# Patient Record
Sex: Female | Born: 1995 | Race: White | Hispanic: No | Marital: Married | Smoking: Never smoker
Health system: Southern US, Community
[De-identification: ages and names within clinical notes are randomized; demographics above are authoritative.]

## PROBLEM LIST (undated history)

## (undated) DIAGNOSIS — N6310 Unspecified lump in the right breast, unspecified quadrant: Secondary | ICD-10-CM

## (undated) DIAGNOSIS — Z803 Family history of malignant neoplasm of breast: Secondary | ICD-10-CM

## (undated) DIAGNOSIS — J45909 Unspecified asthma, uncomplicated: Secondary | ICD-10-CM

## (undated) DIAGNOSIS — Z9189 Other specified personal risk factors, not elsewhere classified: Secondary | ICD-10-CM

## (undated) DIAGNOSIS — Z9889 Other specified postprocedural states: Secondary | ICD-10-CM

## (undated) DIAGNOSIS — R112 Nausea with vomiting, unspecified: Secondary | ICD-10-CM

## (undated) HISTORY — DX: Family history of malignant neoplasm of breast: Z80.3

## (undated) HISTORY — PX: WISDOM TOOTH EXTRACTION: SHX21

## (undated) HISTORY — DX: Other specified personal risk factors, not elsewhere classified: Z91.89

## (undated) HISTORY — DX: Unspecified asthma, uncomplicated: J45.909

## (undated) HISTORY — PX: BREAST SURGERY: SHX581

---
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
## 2016-02-15 ENCOUNTER — Encounter: Payer: Self-pay | Admitting: Physician Assistant

## 2016-02-15 ENCOUNTER — Ambulatory Visit (INDEPENDENT_AMBULATORY_CARE_PROVIDER_SITE_OTHER): Payer: BC Managed Care – PPO | Admitting: Physician Assistant

## 2016-02-15 VITALS — BP 110/60 | HR 78 | Temp 98.4°F | Resp 16 | Wt 121.6 lb

## 2016-02-15 DIAGNOSIS — J45909 Unspecified asthma, uncomplicated: Secondary | ICD-10-CM | POA: Diagnosis not present

## 2016-02-15 DIAGNOSIS — Z30011 Encounter for initial prescription of contraceptive pills: Secondary | ICD-10-CM

## 2016-02-15 DIAGNOSIS — Z3040 Encounter for surveillance of contraceptives, unspecified: Secondary | ICD-10-CM

## 2016-02-15 MED ORDER — FLUTICASONE-SALMETEROL 100-50 MCG/DOSE IN AEPB
1.0000 | INHALATION_SPRAY | Freq: Two times a day (BID) | RESPIRATORY_TRACT | Status: DC
Start: 2016-02-15 — End: 2016-02-15

## 2016-02-15 MED ORDER — FLUTICASONE-SALMETEROL 250-50 MCG/DOSE IN AEPB: 1.0000 | INHALATION_SPRAY | Freq: Two times a day (BID) | RESPIRATORY_TRACT | Status: DC

## 2016-02-15 MED ORDER — DESOGESTREL-ETHINYL ESTRADIOL 0.15-30 MG-MCG PO TABS: 1.0000 | ORAL_TABLET | Freq: Every day | ORAL | Status: DC

## 2016-02-15 NOTE — Patient Instructions (Signed)

## 2016-02-15 NOTE — Progress Notes (Signed)
       Patient: Shelley Walton Female    DOB: [birth year]   20 y.o.   MRN: [id number] Visit Date: 02/15/2016  Today's Provider: Mar Daring, PA-C   Chief Complaint  Patient presents with  . Follow-up    medication/birthcontrol   Subjective:    HPI Birth Control Mediaction: Patient presents today for refill on contraception medication Reclipsen.The patient has no complaints today. The patient is sexually active. Pertinent past medical history: sexually transmitted diseases, chlamydia x 1. No symptoms today. No breakthrough bleeding.  Asthma: She needs refill also on her Advair. She reports that is stable no symptoms. No SOB, wheezing or chest tightness or cough.    No Known Allergies Current Meds  Medication Sig  . desogestrel-ethinyl estradiol (APRI,EMOQUETTE,SOLIA) 0.15-30 MG-MCG tablet Take 1 tablet by mouth daily.  . ondansetron (ZOFRAN) 4 MG tablet Take 1 tablet (4 mg total) by mouth daily as needed for nausea or vomiting.    Review of Systems  Constitutional: Negative.   Respiratory: Negative for cough, chest tightness, shortness of breath and wheezing.   Cardiovascular: Negative for chest pain, palpitations and leg swelling.  Gastrointestinal: Negative.   Genitourinary: Negative.     Social History  Substance Use Topics  . Smoking status: Never Smoker   . Smokeless tobacco: Never Used  . Alcohol Use: No   Objective:   BP 110/60 mmHg  Pulse 78  Temp(Src) 98.4 F (36.9 C) (Oral)  Resp 16  Wt 121 lb 9.6 oz (55.157 kg)  LMP 02/08/2016  Physical Exam  Constitutional: She appears well-developed and well-nourished. No distress.  Neck: Normal range of motion. Neck supple. No tracheal deviation present. No thyromegaly present.  Cardiovascular: Normal rate, regular rhythm and normal heart sounds.  Exam reveals no gallop and no friction rub.   No murmur heard. Pulmonary/Chest: Effort normal and breath sounds normal. No respiratory distress. She has no  wheezes. She has no rales.  Lymphadenopathy:    She has no cervical adenopathy.  Skin: She is not diaphoretic.  Vitals reviewed.       Assessment & Plan:     1. Asthma, unspecified asthma severity, uncomplicated Stable. Diagnosis pulled for medication refill. Continue current medical treatment plan. - Fluticasone-Salmeterol (ADVAIR) 250-50 MCG/DOSE AEPB; Inhale 1 puff into the lungs 2 (two) times daily.  Dispense: 60 each; Refill: 11  2. Contraception, generic surveillance Stable. Diagnosis pulled for medication refill. Continue current medical treatment plan. - desogestrel-ethinyl estradiol (APRI,EMOQUETTE,SOLIA) 0.15-30 MG-MCG tablet; Take 1 tablet by mouth daily.  Dispense: 3 Package; Refill: [place], PA-C  [facility]

## 2016-09-05 ENCOUNTER — Other Ambulatory Visit: Payer: Self-pay | Admitting: General Surgery

## 2016-09-05 DIAGNOSIS — D242 Benign neoplasm of left breast: Secondary | ICD-10-CM

## 2016-09-05 DIAGNOSIS — N63 Unspecified lump in unspecified breast: Secondary | ICD-10-CM

## 2016-09-26 ENCOUNTER — Encounter: Payer: Self-pay | Admitting: Physician Assistant

## 2016-09-26 ENCOUNTER — Ambulatory Visit (INDEPENDENT_AMBULATORY_CARE_PROVIDER_SITE_OTHER): Payer: BC Managed Care – PPO | Admitting: Physician Assistant

## 2016-09-26 VITALS — BP 112/76 | HR 76 | Temp 98.4°F | Resp 16 | Wt 133.0 lb

## 2016-09-26 DIAGNOSIS — J069 Acute upper respiratory infection, unspecified: Secondary | ICD-10-CM | POA: Diagnosis not present

## 2016-09-26 DIAGNOSIS — B9789 Other viral agents as the cause of diseases classified elsewhere: Secondary | ICD-10-CM | POA: Diagnosis not present

## 2016-09-26 DIAGNOSIS — J45909 Unspecified asthma, uncomplicated: Secondary | ICD-10-CM

## 2016-09-26 NOTE — Progress Notes (Signed)
[facility]  Chief Complaint  Patient presents with  . URI    Subjective:    Patient ID: Shelley Walton, female    DOB: [birth year], 21 y.o.   MRN: [id number]  Upper Respiratory Infection: Shelley Walton is a 21 y.o. female with a past medical history significant for asthma on albuterol complaining of symptoms of a URI ongoing since yesterday morning. She works with [employer]. Symptoms include congestion and cough. Onset of symptoms was 1 day ago, gradually worsening since that time. She also c/o bilateral ear pressure/pain, congestion, cough described as nonproductive, lightheadedness and nasal congestion for the past 1 day .  She is drinking plenty of fluids. Evaluation to date: none. Treatment to date: cough suppressants. Has not tried her albuterol inhaler. The treatment has provided no relief.   Review of Systems  Constitutional: Positive for chills and fatigue. Negative for activity change, appetite change, diaphoresis, fever and unexpected weight change.  HENT: Positive for congestion, hearing loss (Ears "Feel full"), sinus pain and sinus pressure. Negative for ear discharge, ear pain, mouth sores, nosebleeds, postnasal drip, rhinorrhea, sneezing, sore throat, tinnitus, trouble swallowing and voice change.   Eyes: Negative.   Respiratory: Positive for cough, chest tightness, shortness of breath and wheezing. Negative for apnea, choking and stridor.   Gastrointestinal: Positive for nausea. Negative for abdominal distention, abdominal pain, anal bleeding, blood in stool, constipation, diarrhea, rectal pain and vomiting.  Neurological: Positive for light-headedness and headaches. Negative for dizziness.       Objective:   BP 112/76 (BP Location: Left Arm, Patient Position: Sitting, Cuff Size: Normal)   Pulse 76   Temp 98.4 F (36.9 C) (Oral)   Resp 16   Wt 133 lb (60.3 kg)   LMP 09/05/2016   SpO2 97%   BMI 22.83 kg/m   Patient  Active Problem List   Diagnosis Date Noted  . Contraception, generic surveillance 06/20/2015  . Chlamydia 06/09/2015  . Acne 02/24/2015  . Asthma 02/24/2015    Outpatient Encounter Prescriptions as of 09/26/2016  Medication Sig  . desogestrel-ethinyl estradiol (APRI,EMOQUETTE,SOLIA) 0.15-30 MG-MCG tablet Take 1 tablet by mouth daily.  . Fluticasone-Salmeterol (ADVAIR) 250-50 MCG/DOSE AEPB Inhale 1 puff into the lungs 2 (two) times daily.  . ondansetron (ZOFRAN) 4 MG tablet Take 1 tablet (4 mg total) by mouth daily as needed for nausea or vomiting.   No facility-administered encounter medications on file as of 09/26/2016.     No Known Allergies     Physical Exam  Constitutional: She appears well-developed and well-nourished.  HENT:  Right Ear: External ear normal.  Left Ear: External ear normal.  Mouth/Throat: Oropharynx is clear and moist. No oropharyngeal exudate.  Eyes: Right eye exhibits discharge. Left eye exhibits discharge.  Neck: Neck supple.  Cardiovascular: Normal rate and regular rhythm.   Pulmonary/Chest: Effort normal and breath sounds normal. No respiratory distress. She has no wheezes. She has no rales.  No SOB.  Lymphadenopathy:    She has no cervical adenopathy.  Skin: Skin is warm and dry.  Psychiatric: She has a normal mood and affect. Her behavior is normal.       Assessment & Plan:   1. Uncomplicated asthma, unspecified asthma severity, unspecified whether persistent  Oxygenating well, no SOB or wheezing on exam. Advised to use albuterol inhaler Q4H scheduled x 3 days. May call back if not feeling relief and we can add steroids.  2. Viral upper respiratory tract infection  See above.  Recommend rest, fluids, frequent hand washing. Work note provided  Return if symptoms worsen or fail to improve.   Patient Instructions  Asthma, Adult Asthma is a recurring condition in which the airways tighten and narrow. Asthma can make it difficult to  breathe. It can cause coughing, wheezing, and shortness of breath. Asthma episodes, also called asthma attacks, range from minor to life-threatening. Asthma cannot be cured, but medicines and lifestyle changes can help control it. What are the causes? Asthma is believed to be caused by inherited (genetic) and environmental factors, but its exact cause is unknown. Asthma may be triggered by allergens, lung infections, or irritants in the air. Asthma triggers are different for each person. Common triggers include:  Animal dander.  Dust mites.  Cockroaches.  Pollen from trees or grass.  Mold.  Smoke.  Air pollutants such as dust, household cleaners, hair sprays, aerosol sprays, paint fumes, strong chemicals, or strong odors.  Cold air, weather changes, and winds (which increase molds and pollens in the air).  Strong emotional expressions such as crying or laughing hard.  Stress.  Certain medicines (such as aspirin) or types of drugs (such as beta-blockers).  Sulfites in foods and drinks. Foods and drinks that may contain sulfites include dried fruit, potato chips, and sparkling grape juice.  Infections or inflammatory conditions such as the flu, a cold, or an inflammation of the nasal membranes (rhinitis).  Gastroesophageal reflux disease (GERD).  Exercise or strenuous activity. What are the signs or symptoms? Symptoms may occur immediately after asthma is triggered or many hours later. Symptoms include:  Wheezing.  Excessive nighttime or early morning coughing.  Frequent or severe coughing with a common cold.  Chest tightness.  Shortness of breath. How is this diagnosed? The diagnosis of asthma is made by a review of your medical history and a physical exam. Tests may also be performed. These may include:  Lung function studies. These tests show how much air you breathe in and out.  Allergy tests.  Imaging tests such as X-rays. How is this treated? Asthma cannot be  cured, but it can usually be controlled. Treatment involves identifying and avoiding your asthma triggers. It also involves medicines. There are 2 classes of medicine used for asthma treatment:  Controller medicines. These prevent asthma symptoms from occurring. They are usually taken every day.  Reliever or rescue medicines. These quickly relieve asthma symptoms. They are used as needed and provide short-term relief. Your health care provider will help you create an asthma action plan. An asthma action plan is a written plan for managing and treating your asthma attacks. It includes a list of your asthma triggers and how they may be avoided. It also includes information on when medicines should be taken and when their dosage should be changed. An action plan may also involve the use of a device called a peak flow meter. A peak flow meter measures how well the lungs are working. It helps you monitor your condition. Follow these instructions at home:  Take medicines only as directed by your health care provider. Speak with your health care provider if you have questions about how or when to take the medicines.  Use a peak flow meter as directed by your health care provider. Record and keep track of readings.  Understand and use the action plan to help minimize or stop an asthma attack without needing to seek medical care.  Control your home environment in the following ways to help prevent asthma attacks:  Do not smoke. Avoid being exposed to secondhand smoke.  Change your heating and air conditioning filter regularly.  Limit your use of fireplaces and wood stoves.  Get rid of pests (such as roaches and mice) and their droppings.  Throw away plants if you see mold on them.  Clean your floors and dust regularly. Use unscented cleaning products.  Try to have someone else vacuum for you regularly. Stay out of rooms while they are being vacuumed and for a short while afterward. If you vacuum,  use a dust mask from a hardware store, a double-layered or microfilter vacuum cleaner bag, or a vacuum cleaner with a HEPA filter.  Replace carpet with wood, tile, or vinyl flooring. Carpet can trap dander and dust.  Use allergy-proof pillows, mattress covers, and box spring covers.  Wash bed sheets and blankets every week in hot water and dry them in a dryer.  Use blankets that are made of polyester or cotton.  Clean bathrooms and kitchens with bleach. If possible, have someone repaint the walls in these rooms with mold-resistant paint. Keep out of the rooms that are being cleaned and painted.  Wash hands frequently. Contact a health care provider if:  You have wheezing, shortness of breath, or a cough even if taking medicine to prevent attacks.  The colored mucus you cough up (sputum) is thicker than usual.  Your sputum changes from clear or white to yellow, green, gray, or bloody.  You have any problems that may be related to the medicines you are taking (such as a rash, itching, swelling, or trouble breathing).  You are using a reliever medicine more than 2-3 times per week.  Your peak flow is still at 50-79% of your personal best after following your action plan for 1 hour.  You have a fever. Get help right away if:  You seem to be getting worse and are unresponsive to treatment during an asthma attack.  You are short of breath even at rest.  You get short of breath when doing very little physical activity.  You have difficulty eating, drinking, or talking due to asthma symptoms.  You develop chest pain.  You develop a fast heartbeat.  You have a bluish color to your lips or fingernails.  You are light-headed, dizzy, or faint.  Your peak flow is less than 50% of your personal best. This information is not intended to replace advice given to you by your health care provider. Make sure you discuss any questions you have with your health care provider. Document  Released: 08/13/2005 Document Revised: 01/25/2016 Document Reviewed: 03/12/2013 Elsevier Interactive Patient Education  2017 [employer].     The entirety of the information documented in the History of Present Illness, Review of Systems and Physical Exam were personally obtained by me. Portions of this information were initially documented by Ashley Royalty, CMA and reviewed by me for thoroughness and accuracy.

## 2016-09-26 NOTE — Patient Instructions (Signed)

## 2016-09-28 ENCOUNTER — Ambulatory Visit
Admission: RE | Admit: 2016-09-28 | Discharge: 2016-09-28 | Disposition: A | Discharge status: Home / Self Care | Payer: BC Managed Care – PPO | Source: Ambulatory Visit | Attending: General Surgery | Admitting: General Surgery

## 2016-09-28 DIAGNOSIS — D242 Benign neoplasm of left breast: Secondary | ICD-10-CM

## 2016-09-28 DIAGNOSIS — N63 Unspecified lump in unspecified breast: Secondary | ICD-10-CM

## 2016-10-26 ENCOUNTER — Encounter: Payer: Self-pay | Admitting: Physician Assistant

## 2016-10-26 ENCOUNTER — Ambulatory Visit (INDEPENDENT_AMBULATORY_CARE_PROVIDER_SITE_OTHER): Payer: BC Managed Care – PPO | Admitting: Physician Assistant

## 2016-10-26 VITALS — BP 102/60 | HR 98 | Temp 98.4°F | Resp 16 | Wt 137.0 lb

## 2016-10-26 DIAGNOSIS — J039 Acute tonsillitis, unspecified: Secondary | ICD-10-CM | POA: Diagnosis not present

## 2016-10-26 DIAGNOSIS — J029 Acute pharyngitis, unspecified: Secondary | ICD-10-CM | POA: Diagnosis not present

## 2016-10-26 LAB — POCT RAPID STREP A (OFFICE): RAPID STREP A SCREEN: NEGATIVE

## 2016-10-26 MED ORDER — AMOXICILLIN-POT CLAVULANATE 875-125 MG PO TABS: 1.0000 | ORAL_TABLET | Freq: Two times a day (BID) | ORAL | 0 refills | Status: DC

## 2016-10-26 MED ORDER — PREDNISONE 10 MG (21) PO TBPK: ORAL_TABLET | ORAL | 0 refills | Status: DC

## 2016-10-26 NOTE — Patient Instructions (Signed)
Tonsillitis Tonsillitis is an infection of the throat that causes the tonsils to become red, tender, and swollen. Tonsils are collections of lymphoid tissue at the back of the throat. Each tonsil has crevices (crypts). Tonsils help fight nose and throat infections and keep infection from spreading to other parts of the body for the first 18 months of life. What are the causes? Sudden (acute) tonsillitis is usually caused by infection with streptococcal bacteria. Long-lasting (chronic) tonsillitis occurs when the crypts of the tonsils become filled with pieces of food and bacteria, which makes it easy for the tonsils to become repeatedly infected. What are the signs or symptoms? Symptoms of tonsillitis include:  A sore throat, with possible difficulty swallowing.  White patches on the tonsils.  Fever.  Tiredness.  New episodes of snoring during sleep, when you did not snore before.  Small, foul-smelling, yellowish-white pieces of material (tonsilloliths) that you occasionally cough up or spit out. The tonsilloliths can also cause you to have bad breath.  How is this diagnosed? Tonsillitis can be diagnosed through a physical exam. Diagnosis can be confirmed with the results of lab tests, including a throat culture. How is this treated? The goals of tonsillitis treatment include the reduction of the severity and duration of symptoms and prevention of associated conditions. Symptoms of tonsillitis can be improved with the use of steroids to reduce the swelling. Tonsillitis caused by bacteria can be treated with antibiotic medicines. Usually, treatment with antibiotic medicines is started before the cause of the tonsillitis is known. However, if it is determined that the cause is not bacterial, antibiotic medicines will not treat the tonsillitis. If attacks of tonsillitis are severe and frequent, your health care provider may recommend surgery to remove the tonsils (tonsillectomy). Follow these  instructions at home:  Rest as much as possible and get plenty of sleep.  Drink plenty of fluids. While the throat is very sore, eat soft foods or liquids, such as sherbet, soups, or instant breakfast drinks.  Eat frozen ice pops.  Gargle with a warm or cold liquid to help soothe the throat. Mix 1/4 teaspoon of salt and 1/4 teaspoon of baking soda in 8 oz of water. Contact a health care provider if:  Large, tender lumps develop in your neck.  A rash develops.  A green, yellow-brown, or bloody substance is coughed up.  You are unable to swallow liquids or food for 24 hours.  You notice that only one of the tonsils is swollen. Get help right away if:  You develop any new symptoms such as vomiting, severe headache, stiff neck, chest pain, or trouble breathing or swallowing.  You have severe throat pain along with drooling or voice changes.  You have severe pain, unrelieved with recommended medications.  You are unable to fully open the mouth.  You develop redness, swelling, or severe pain anywhere in the neck.  You have a fever. This information is not intended to replace advice given to you by your health care provider. Make sure you discuss any questions you have with your health care provider. Document Released: 05/23/2005 Document Revised: 01/19/2016 Document Reviewed: 01/30/2013 Elsevier Interactive Patient Education  2017 Else[employer] Inc.  

## 2016-10-26 NOTE — Progress Notes (Signed)
Patient: Shelley Walton Female    DOB: 1996/07/11   21 y.o.   MRN: [id number] Visit Date: 10/26/2016  Today's Provider: Mar Daring, PA-C   Chief Complaint  Patient presents with  . Sore Throat   Subjective:    Sore Throat   This is a recurrent problem. The current episode started 1 to 4 weeks ago. The problem has been unchanged. Neither side of throat is experiencing more pain than the other. There has been no fever. The pain is moderate. Associated symptoms include trouble swallowing. Pertinent negatives include no abdominal pain, congestion, coughing, ear pain, headaches, hoarse voice, shortness of breath or vomiting. Exposure to: she was treated with amoxicillin. She took last dose 1-2 days ago. Strep was negative at [facility] but still treated her since she works with [employer]. The treatment provided no relief.      No Known Allergies   Current Outpatient Prescriptions:  .  desogestrel-ethinyl estradiol (APRI,EMOQUETTE,SOLIA) 0.15-30 MG-MCG tablet, Take 1 tablet by mouth daily., Disp: 3 Package, Rfl: 4 .  Fluticasone-Salmeterol (ADVAIR) 250-50 MCG/DOSE AEPB, Inhale 1 puff into the lungs 2 (two) times daily., Disp: 60 each, Rfl: 11 .  ondansetron (ZOFRAN) 4 MG tablet, Take 1 tablet (4 mg total) by mouth daily as needed for nausea or vomiting. (Patient not taking: Reported on 10/26/2016), Disp: 20 tablet, Rfl: 1  Review of Systems  Constitutional: Negative.   HENT: Positive for postnasal drip, rhinorrhea, sore throat and trouble swallowing. Negative for congestion, ear pain, hoarse voice, sinus pain, sinus pressure and sneezing.   Respiratory: Negative for cough, chest tightness and shortness of breath.   Cardiovascular: Negative for chest pain and palpitations.  Gastrointestinal: Negative for abdominal pain, nausea and vomiting.  Neurological: Negative for dizziness, light-headedness and headaches.    Social History  Substance Use Topics  . Smoking status:  Never Smoker  . Smokeless tobacco: Never Used  . Alcohol use No   Objective:   BP 102/60 (BP Location: Right Arm, Patient Position: Sitting, Cuff Size: Normal)   Pulse 98   Temp 98.4 F (36.9 C) (Oral)   Resp 16   Wt 137 lb (62.1 kg)   SpO2 98%   BMI 23.52 kg/m     Physical Exam  Constitutional: She appears well-developed and well-nourished. No distress.  HENT:  Head: Normocephalic and atraumatic.  Right Ear: Hearing, tympanic membrane, external ear and ear canal normal.  Left Ear: Hearing, tympanic membrane, external ear and ear canal normal.  Nose: Nose normal.  Mouth/Throat: Uvula is midline and mucous membranes are normal. Posterior oropharyngeal edema and posterior oropharyngeal erythema present. No oropharyngeal exudate or tonsillar abscesses.  Eyes: Conjunctivae are normal. Pupils are equal, round, and reactive to light. Right eye exhibits no discharge. Left eye exhibits no discharge. No scleral icterus.  Neck: Normal range of motion. Neck supple. No tracheal deviation present. No thyromegaly present.  Cardiovascular: Normal rate, regular rhythm and normal heart sounds.  Exam reveals no gallop and no friction rub.   No murmur heard. Pulmonary/Chest: Effort normal and breath sounds normal. No stridor. No respiratory distress. She has no wheezes. She has no rales.  Lymphadenopathy:    She has no cervical adenopathy.  Skin: Skin is warm and dry. She is not diaphoretic.  Vitals reviewed.     Assessment & Plan:     1. Tonsillitis Enlarged, erythematous tonsils, not kissing, no exudate. Will treat with prednisone for inflammation and augmentin. Advised to use salt  water gargles and chloraseptic spray. Referral placed to ENT for consideration of tonsilectomy since she reports she gets this at least once per year.  - predniSONE (STERAPRED UNI-PAK 21 TAB) 10 MG (21) TBPK tablet; Take as directed on package instructions  Dispense: 21 tablet; Refill: 0 - amoxicillin-clavulanate  (AUGMENTIN) 875-125 MG tablet; Take 1 tablet by mouth 2 (two) times daily.  Dispense: 20 tablet; Refill: 0 - Ambulatory referral to ENT  2. Sore throat Strep negative. - POCT rapid strep A       Mar Daring, PA-C  [facility]

## 2017-01-04 ENCOUNTER — Ambulatory Visit (INDEPENDENT_AMBULATORY_CARE_PROVIDER_SITE_OTHER): Payer: BC Managed Care – PPO | Admitting: Physician Assistant

## 2017-01-04 ENCOUNTER — Encounter: Payer: Self-pay | Admitting: Physician Assistant

## 2017-01-04 VITALS — BP 114/68 | HR 68 | Temp 98.4°F | Resp 16 | Wt 135.0 lb

## 2017-01-04 DIAGNOSIS — N3090 Cystitis, unspecified without hematuria: Secondary | ICD-10-CM | POA: Diagnosis not present

## 2017-01-04 DIAGNOSIS — N898 Other specified noninflammatory disorders of vagina: Secondary | ICD-10-CM

## 2017-01-04 DIAGNOSIS — Z113 Encounter for screening for infections with a predominantly sexual mode of transmission: Secondary | ICD-10-CM

## 2017-01-04 LAB — POCT URINALYSIS DIPSTICK
Bilirubin, UA: NEGATIVE
Glucose, UA: NEGATIVE
Ketones, UA: NEGATIVE
Nitrite, UA: NEGATIVE
Spec Grav, UA: 1.02 (ref 1.010–1.025)
Urobilinogen, UA: 0.2 E.U./dL
pH, UA: 5 (ref 5.0–8.0)

## 2017-01-04 MED ORDER — NITROFURANTOIN MONOHYD MACRO 100 MG PO CAPS
100.0000 mg | ORAL_CAPSULE | Freq: Two times a day (BID) | ORAL | 0 refills | Status: AC
Start: 2017-01-04 — End: 2017-01-09

## 2017-01-04 NOTE — Addendum Note (Signed)
Addended by: Ashley Royalty E on: 01/04/2017 04:14 PM   Modules accepted: Orders

## 2017-01-04 NOTE — Progress Notes (Signed)
[facility]  Chief Complaint  Patient presents with  . Urinary Tract Infection    Started yesterday    Subjective:    Patient ID: Shelley Walton, female    DOB: [birth year], 21 y.o.   MRN: [id number]   Urinary Tract Infection: Patient complains of dysuria, frequency and nausea She has had symptoms for 1 day. Patient also complains of back pain. Patient denies stomach ache and vaginal discharge. Patient does not have a history of recurrent UTI.  Patient does not have a history of pyelonephritis or other renal issues. Patient denies vaginal discharge and reports new sexual partners. The patient denies recent travel outside of the [place].  STD Screening  Has a new sexual partner, monogamous relationship with female. Not using condoms.   Review of Systems  Constitutional: Positive for malaise/fatigue. Negative for chills, diaphoresis, fever and weight loss.  Gastrointestinal: Positive for nausea. Negative for abdominal pain, blood in stool, constipation, diarrhea, heartburn, melena and vomiting.  Genitourinary: Positive for dysuria, frequency and urgency. Negative for flank pain and hematuria.  Musculoskeletal: Positive for back pain.  Neurological: Negative for dizziness, weakness and headaches.       Objective:   BP 114/68 (BP Location: Left Arm, Patient Position: Sitting, Cuff Size: Normal)   Pulse 68   Temp 98.4 F (36.9 C) (Oral)   Resp 16   Wt 135 lb (61.2 kg)   LMP 12/25/2016   BMI 23.17 kg/m   Patient Active Problem List   Diagnosis Date Noted  . Contraception, generic surveillance 06/20/2015  . Chlamydia 06/09/2015  . Acne 02/24/2015  . Asthma 02/24/2015    Outpatient Encounter Prescriptions as of 01/04/2017  Medication Sig  . desogestrel-ethinyl estradiol (APRI,EMOQUETTE,SOLIA) 0.15-30 MG-MCG tablet Take 1 tablet by mouth daily.  . Fluticasone-Salmeterol (ADVAIR) 250-50 MCG/DOSE AEPB Inhale 1 puff into the lungs 2 (two) times daily.  .  montelukast (SINGULAIR) 10 MG tablet   . PROAIR HFA 108 (90 Base) MCG/ACT inhaler   . [DISCONTINUED] amoxicillin-clavulanate (AUGMENTIN) 875-125 MG tablet Take 1 tablet by mouth 2 (two) times daily.  . [DISCONTINUED] ondansetron (ZOFRAN) 4 MG tablet Take 1 tablet (4 mg total) by mouth daily as needed for nausea or vomiting.  . [DISCONTINUED] predniSONE (STERAPRED UNI-PAK 21 TAB) 10 MG (21) TBPK tablet Take as directed on package instructions   No facility-administered encounter medications on file as of 01/04/2017.     No Known Allergies     Physical Exam  Constitutional: She is oriented to person, place, and time. She appears well-developed and well-nourished. No distress.  Cardiovascular: Normal rate and regular rhythm.   Pulmonary/Chest: Effort normal and breath sounds normal.  Abdominal: Soft. Bowel sounds are normal. She exhibits no distension. There is tenderness in the suprapubic area. There is no rebound, no guarding and no CVA tenderness.  Genitourinary: No labial fusion. There is no rash, tenderness, lesion or injury on the right labia. There is no rash, tenderness, lesion or injury on the left labia. Cervix exhibits discharge. Cervix exhibits no motion tenderness and no friability. No erythema, tenderness or bleeding in the vagina. No foreign body in the vagina. No signs of injury around the vagina. No vaginal discharge found.  Genitourinary Comments: Slightly reddened cervix but not friable. Some thick white discharge. Chaperoned exam.   Neurological: She is alert and oriented to person, place, and time.  Skin: Skin is warm and dry. She is not diaphoretic.  Psychiatric: She has a normal mood and affect. Her behavior  is normal.       Assessment & Plan:  1. Cystitis  UA positive for cystitis. Treat as below.   - nitrofurantoin, macrocrystal-monohydrate, (MACROBID) 100 MG capsule; Take 1 capsule (100 mg total) by mouth 2 (two) times daily.  Dispense: 10 capsule; Refill: 0 -  Urine Culture  2. Screening for STD (sexually transmitted disease)  - NuSwab Vaginitis Plus (VG+)  3. Vaginal discharge  - NuSwab Vaginitis Plus (VG+)   Return in about 1 year (around 01/04/2018) for CPE.  The entirety of the information documented in the History of Present Illness, Review of Systems and Physical Exam were personally obtained by me. Portions of this information were initially documented by Ashley Royalty, CMA and reviewed by me for thoroughness and accuracy.

## 2017-01-04 NOTE — Patient Instructions (Signed)

## 2017-01-06 LAB — URINE CULTURE

## 2017-01-06 LAB — PLEASE NOTE

## 2017-01-07 ENCOUNTER — Telehealth: Payer: Self-pay | Admitting: Physician Assistant

## 2017-01-07 NOTE — Telephone Encounter (Signed)
Pt is calling to request test results.  NO#[phone]

## 2017-01-08 NOTE — Telephone Encounter (Signed)
-----   Message from Trinna Post, [place] sent at 01/08/2017  8:39 AM EDT ----- Urine cx came out negative, but patient probably done with abx. Waiting on NuSwab.

## 2017-01-08 NOTE — Telephone Encounter (Signed)
Pt advised.   Thanks,   -Laura  

## 2017-01-09 ENCOUNTER — Telehealth: Payer: Self-pay | Admitting: Physician Assistant

## 2017-01-09 NOTE — Telephone Encounter (Signed)
Pt is requesting results.  OB#[phone]

## 2017-01-10 ENCOUNTER — Telehealth: Payer: Self-pay

## 2017-01-10 DIAGNOSIS — B3731 Acute candidiasis of vulva and vagina: Secondary | ICD-10-CM

## 2017-01-10 DIAGNOSIS — B373 Candidiasis of vulva and vagina: Secondary | ICD-10-CM

## 2017-01-10 LAB — NUSWAB VAGINITIS PLUS (VG+)
Candida albicans, NAA: POSITIVE — AB
Candida glabrata, NAA: NEGATIVE
Chlamydia trachomatis, NAA: NEGATIVE
Neisseria gonorrhoeae, NAA: NEGATIVE
Trich vag by NAA: NEGATIVE

## 2017-01-10 MED ORDER — TERCONAZOLE 0.4 % VA CREA: 1.0000 | TOPICAL_CREAM | Freq: Every day | VAGINAL | 0 refills | Status: AC

## 2017-01-10 NOTE — Telephone Encounter (Signed)
lmtcb Ahlia Lemanski Drozdowski, CMA  

## 2017-01-10 NOTE — Telephone Encounter (Signed)
Left message on cell phone advising pt; ok per DPR. Renaldo Fiddler, CMA

## 2017-01-10 NOTE — Telephone Encounter (Signed)
Pt advised and would like the cream called into [place], CMA

## 2017-01-10 NOTE — Telephone Encounter (Signed)
Sent in vaginal cream, one applicator inserted vaginally before bed for one week.

## 2017-01-10 NOTE — Telephone Encounter (Signed)
See results note. Renaldo Fiddler, CMA

## 2017-01-10 NOTE — Telephone Encounter (Signed)
-----   Message from Trinna Post, [place] sent at 01/10/2017  8:29 AM EDT ----- NuSwab positive for yeast infection but negative for bacterial vaginosis, gonorrhea, chlamydia, and trichomonas. Can send in vaginal cream for her if patient wants this.

## 2017-03-04 ENCOUNTER — Ambulatory Visit (INDEPENDENT_AMBULATORY_CARE_PROVIDER_SITE_OTHER): Payer: BC Managed Care – PPO | Admitting: Physician Assistant

## 2017-03-04 ENCOUNTER — Encounter: Payer: Self-pay | Admitting: Physician Assistant

## 2017-03-04 VITALS — BP 118/70 | HR 94 | Temp 98.1°F | Resp 16 | Wt 137.2 lb

## 2017-03-04 DIAGNOSIS — J0140 Acute pansinusitis, unspecified: Secondary | ICD-10-CM

## 2017-03-04 DIAGNOSIS — J3089 Other allergic rhinitis: Secondary | ICD-10-CM

## 2017-03-04 MED ORDER — LORATADINE 10 MG PO TABS: 10.0000 mg | ORAL_TABLET | Freq: Every day | ORAL | 11 refills | Status: DC

## 2017-03-04 MED ORDER — AMOXICILLIN-POT CLAVULANATE 875-125 MG PO TABS: 1.0000 | ORAL_TABLET | Freq: Two times a day (BID) | ORAL | 0 refills | Status: DC

## 2017-03-04 NOTE — Patient Instructions (Signed)
Add Mucinex DM  Sinusitis, Adult Sinusitis is soreness and inflammation of your sinuses. Sinuses are hollow spaces in the bones around your face. Your sinuses are located:  Around your eyes.  In the middle of your forehead.  Behind your nose.  In your cheekbones.  Your sinuses and nasal passages are lined with a stringy fluid (mucus). Mucus normally drains out of your sinuses. When your nasal tissues become inflamed or swollen, the mucus can become trapped or blocked so air cannot flow through your sinuses. This allows bacteria, viruses, and funguses to grow, which leads to infection. Sinusitis can develop quickly and last for 7?10 days (acute) or for more than 12 weeks (chronic). Sinusitis often develops after a cold. What are the causes? This condition is caused by anything that creates swelling in the sinuses or stops mucus from draining, including:  Allergies.  Asthma.  Bacterial or viral infection.  Abnormally shaped bones between the nasal passages.  Nasal growths that contain mucus (nasal polyps).  Narrow sinus openings.  Pollutants, such as chemicals or irritants in the air.  A foreign object stuck in the nose.  A fungal infection. This is rare.  What increases the risk? The following factors may make you more likely to develop this condition:  Having allergies or asthma.  Having had a recent cold or respiratory tract infection.  Having structural deformities or blockages in your nose or sinuses.  Having a weak immune system.  Doing a lot of swimming or diving.  Overusing nasal sprays.  Smoking.  What are the signs or symptoms? The main symptoms of this condition are pain and a feeling of pressure around the affected sinuses. Other symptoms include:  Upper toothache.  Earache.  Headache.  Bad breath.  Decreased sense of smell and taste.  A cough that may get worse at night.  Fatigue.  Fever.  Thick drainage from your nose. The drainage  is often green and it may contain pus (purulent).  Stuffy nose or congestion.  Postnasal drip. This is when extra mucus collects in the throat or back of the nose.  Swelling and warmth over the affected sinuses.  Sore throat.  Sensitivity to light.  How is this diagnosed? This condition is diagnosed based on symptoms, a medical history, and a physical exam. To find out if your condition is acute or chronic, your health care provider may:  Look in your nose for signs of nasal polyps.  Tap over the affected sinus to check for signs of infection.  View the inside of your sinuses using an imaging device that has a light attached (endoscope).  If your health care provider suspects that you have chronic sinusitis, you may also:  Be tested for allergies.  Have a sample of mucus taken from your nose (nasal culture) and checked for bacteria.  Have a mucus sample examined to see if your sinusitis is related to an allergy.  If your sinusitis does not respond to treatment and it lasts longer than 8 weeks, you may have an MRI or CT scan to check your sinuses. These scans also help to determine how severe your infection is. In rare cases, a bone biopsy may be done to rule out more serious types of fungal sinus disease. How is this treated? Treatment for sinusitis depends on the cause and whether your condition is chronic or acute. If a virus is causing your sinusitis, your symptoms will go away on their own within 10 days. You may be given medicines  to relieve your symptoms, including:  Topical nasal decongestants. They shrink swollen nasal passages and let mucus drain from your sinuses.  Antihistamines. These drugs block inflammation that is triggered by allergies. This can help to ease swelling in your nose and sinuses.  Topical nasal corticosteroids. These are nasal sprays that ease inflammation and swelling in your nose and sinuses.  Nasal saline washes. These rinses can help to get rid  of thick mucus in your nose.  If your condition is caused by bacteria, you will be given an antibiotic medicine. If your condition is caused by a fungus, you will be given an antifungal medicine. Surgery may be needed to correct underlying conditions, such as narrow nasal passages. Surgery may also be needed to remove polyps. Follow these instructions at home: Medicines  Take, use, or apply over-the-counter and prescription medicines only as told by your health care provider. These may include nasal sprays.  If you were prescribed an antibiotic medicine, take it as told by your health care provider. Do not stop taking the antibiotic even if you start to feel better. Hydrate and Humidify  Drink enough water to keep your urine clear or pale yellow. Staying hydrated will help to thin your mucus.  Use a cool mist humidifier to keep the humidity level in your home above 50%.  Inhale steam for 10-15 minutes, 3-4 times a day or as told by your health care provider. You can do this in the bathroom while a hot shower is running.  Limit your exposure to cool or dry air. Rest  Rest as much as possible.  Sleep with your head raised (elevated).  Make sure to get enough sleep each night. General instructions  Apply a warm, moist washcloth to your face 3-4 times a day or as told by your health care provider. This will help with discomfort.  Wash your hands often with soap and water to reduce your exposure to viruses and other germs. If soap and water are not available, use hand sanitizer.  Do not smoke. Avoid being around people who are smoking (secondhand smoke).  Keep all follow-up visits as told by your health care provider. This is important. Contact a health care provider if:  You have a fever.  Your symptoms get worse.  Your symptoms do not improve within 10 days. Get help right away if:  You have a severe headache.  You have persistent vomiting.  You have pain or swelling  around your face or eyes.  You have vision problems.  You develop confusion.  Your neck is stiff.  You have trouble breathing. This information is not intended to replace advice given to you by your health care provider. Make sure you discuss any questions you have with your health care provider. Document Released: 08/13/2005 Document Revised: 04/08/2016 Document Reviewed: 06/08/2015 Elsevier Interactive Patient Education  2017 [employer].

## 2017-03-04 NOTE — Progress Notes (Signed)
Patient: Shelley Walton Female    DOB: Aug 19, 1996   21 y.o.   MRN: [id number] Visit Date: 03/04/2017  Today's Provider: Mar Daring, PA-C   Chief Complaint  Patient presents with  . Sinusitis   Subjective:    Sinusitis  This is a new problem. The current episode started 1 to 4 weeks ago. The problem has been gradually worsening since onset. There has been no fever. Her pain is at a severity of 6/10. The pain is moderate. Associated symptoms include congestion, coughing, headaches, a hoarse voice, shortness of breath (last night and used Advair), sinus pressure, sneezing and a sore throat. Pertinent negatives include no chills or ear pain. Past treatments include saline sprays and acetaminophen. The treatment provided no relief.       No Known Allergies   Current Outpatient Prescriptions:  .  desogestrel-ethinyl estradiol (APRI,EMOQUETTE,SOLIA) 0.15-30 MG-MCG tablet, Take 1 tablet by mouth daily., Disp: 3 Package, Rfl: 4 .  montelukast (SINGULAIR) 10 MG tablet, , Disp: , Rfl:  .  PROAIR HFA 108 (90 Base) MCG/ACT inhaler, , Disp: , Rfl:  .  Fluticasone-Salmeterol (ADVAIR) 250-50 MCG/DOSE AEPB, Inhale 1 puff into the lungs 2 (two) times daily. (Patient not taking: Reported on 03/04/2017), Disp: 60 each, Rfl: 11  Review of Systems  Constitutional: Negative for chills and fever.  HENT: Positive for congestion, hoarse voice, postnasal drip, rhinorrhea, sinus pain, sinus pressure, sneezing and sore throat. Negative for ear pain.   Eyes: Positive for redness and itching.  Respiratory: Positive for cough, chest tightness, shortness of breath (last night and used Advair) and wheezing.   Cardiovascular: Negative for chest pain, palpitations and leg swelling.  Gastrointestinal: Negative for abdominal pain.  Neurological: Positive for dizziness, light-headedness and headaches.    Social History  Substance Use Topics  . Smoking status: Never Smoker  . Smokeless tobacco: Never  Used  . Alcohol use No   Objective:   BP 118/70 (BP Location: Left Arm, Patient Position: Sitting, Cuff Size: Normal)   Pulse 94   Temp 98.1 F (36.7 C) (Oral)   Resp 16   Wt 137 lb 3.2 oz (62.2 kg)   LMP 02/18/2017 (Approximate)   SpO2 98%   BMI 23.55 kg/m  Vitals:   03/04/17 1622  BP: 118/70  Pulse: 94  Resp: 16  Temp: 98.1 F (36.7 C)  TempSrc: Oral  SpO2: 98%  Weight: 137 lb 3.2 oz (62.2 kg)     Physical Exam  Constitutional: She appears well-developed and well-nourished. No distress.  HENT:  Head: Normocephalic and atraumatic.  Right Ear: Hearing, tympanic membrane, external ear and ear canal normal.  Left Ear: Hearing, tympanic membrane, external ear and ear canal normal.  Nose: Right sinus exhibits maxillary sinus tenderness and frontal sinus tenderness. Left sinus exhibits maxillary sinus tenderness and frontal sinus tenderness.  Mouth/Throat: Uvula is midline, oropharynx is clear and moist and mucous membranes are normal. No oropharyngeal exudate.  Neck: Normal range of motion. Neck supple. No tracheal deviation present. No thyromegaly present.  Cardiovascular: Normal rate, regular rhythm and normal heart sounds.  Exam reveals no gallop and no friction rub.   No murmur heard. Pulmonary/Chest: Effort normal and breath sounds normal. No stridor. No respiratory distress. She has no wheezes. She has no rales.  Lymphadenopathy:    She has no cervical adenopathy.  Skin: She is not diaphoretic.  Vitals reviewed.       Assessment & Plan:  1. Acute pansinusitis, recurrence not specified Worsening symptoms that have not responded to OTC medications. Will give augmentin as below. Continue allergy medications. Stay well hydrated and get plenty of rest. Call if no symptom improvement or if symptoms worsen. - amoxicillin-clavulanate (AUGMENTIN) 875-125 MG tablet; Take 1 tablet by mouth 2 (two) times daily.  Dispense: 20 tablet; Refill: 0  2. Non-seasonal allergic  rhinitis, unspecified trigger Stable. Diagnosis pulled for medication refill. Continue current medical treatment plan. - loratadine (CLARITIN) 10 MG tablet; Take 1 tablet (10 mg total) by mouth daily.  Dispense: 30 tablet; Refill: [place], PA-C  [facility]

## 2017-03-09 ENCOUNTER — Other Ambulatory Visit: Payer: Self-pay | Admitting: Physician Assistant

## 2017-03-09 DIAGNOSIS — J45909 Unspecified asthma, uncomplicated: Secondary | ICD-10-CM

## 2017-03-18 ENCOUNTER — Other Ambulatory Visit: Payer: Self-pay | Admitting: Physician Assistant

## 2017-03-18 ENCOUNTER — Encounter: Payer: Self-pay | Admitting: Physician Assistant

## 2017-03-18 ENCOUNTER — Ambulatory Visit (INDEPENDENT_AMBULATORY_CARE_PROVIDER_SITE_OTHER): Payer: BC Managed Care – PPO | Admitting: Physician Assistant

## 2017-03-18 VITALS — BP 118/78 | HR 95 | Resp 16 | Wt 138.0 lb

## 2017-03-18 DIAGNOSIS — N949 Unspecified condition associated with female genital organs and menstrual cycle: Secondary | ICD-10-CM | POA: Diagnosis not present

## 2017-03-18 DIAGNOSIS — N898 Other specified noninflammatory disorders of vagina: Secondary | ICD-10-CM

## 2017-03-18 MED ORDER — FLUCONAZOLE 150 MG PO TABS: ORAL_TABLET | ORAL | 0 refills | Status: DC

## 2017-03-18 MED ORDER — VALACYCLOVIR HCL 1 G PO TABS: 1000.0000 mg | ORAL_TABLET | Freq: Two times a day (BID) | ORAL | 0 refills | Status: DC

## 2017-03-18 NOTE — Progress Notes (Addendum)
Patient: Shelley Walton Female    DOB: 01-22-1996   21 y.o.   MRN: [id number] Visit Date: 03/18/2017  Today's Provider: Trinna Post, PA-C   Chief Complaint  Patient presents with  . Vaginal Discharge   Subjective:      Shelley Walton is a 21 y/o woman with history of chlamyda and recent antibiotic use presenting today with vaginal discharge, itching, and pain. She reports being on antibiotics for sinusitis earlier this month. After completing course, she began having white discharge. She also had itching. She accidentally used acne cream with salicylic acid vaginally before realizing it was not yeast infection cream. She washed it off in the shower but feels this made her burning worse.   She also has concern for herpes infection because she has had some burning before. Also her partner has cold sore and performed oral sex. She does not use barrier protection such as condom. Denies concern for gonorrhea and chlamydia.   Vaginal Discharge  The patient's primary symptoms include genital lesions, pelvic pain and vaginal discharge. This is a new problem. The current episode started in the past 7 days. The problem has been gradually worsening. Pertinent negatives include no dysuria, flank pain, frequency, headaches, hematuria or urgency. The vaginal discharge was white. She has not been passing clots. She has not been passing tissue. She is sexually active. It is possible that her partner has an STD. She uses oral contraceptives for contraception. Her menstrual history has been regular. Her past medical history is significant for an STD.       No Known Allergies   Current Outpatient Prescriptions:  .  ADVAIR DISKUS 250-50 MCG/DOSE AEPB, INHALE 1 PUFF INTO THE LUNGS TWICE DAILY, Disp: 60 each, Rfl: 5 .  desogestrel-ethinyl estradiol (APRI,EMOQUETTE,SOLIA) 0.15-30 MG-MCG tablet, Take 1 tablet by mouth daily., Disp: 3 Package, Rfl: 4 .  loratadine (CLARITIN) 10 MG tablet, Take 1  tablet (10 mg total) by mouth daily., Disp: 30 tablet, Rfl: 11 .  montelukast (SINGULAIR) 10 MG tablet, , Disp: , Rfl:  .  PROAIR HFA 108 (90 Base) MCG/ACT inhaler, , Disp: , Rfl:  .  amoxicillin-clavulanate (AUGMENTIN) 875-125 MG tablet, Take 1 tablet by mouth 2 (two) times daily. (Patient not taking: Reported on 03/18/2017), Disp: 20 tablet, Rfl: 0 .  fluconazole (DIFLUCAN) 150 MG tablet, Take one pill today. Take second pill if symptomatic in three days., Disp: 2 tablet, Rfl: 0 .  valACYclovir (VALTREX) 1000 MG tablet, Take 1 tablet (1,000 mg total) by mouth 2 (two) times daily., Disp: 20 tablet, Rfl: 0  Review of Systems  Constitutional: Negative.   Gastrointestinal: Negative.   Genitourinary: Positive for genital sores, pelvic pain and vaginal discharge. Negative for decreased urine volume, difficulty urinating, dyspareunia, dysuria, enuresis, flank pain, frequency, hematuria, menstrual problem, urgency, vaginal bleeding and vaginal pain.  Neurological: Negative for dizziness, light-headedness and headaches.    Social History  Substance Use Topics  . Smoking status: Never Smoker  . Smokeless tobacco: Never Used  . Alcohol use No   Objective:   BP 118/78 (BP Location: Left Arm, Patient Position: Sitting, Cuff Size: Normal)   Pulse 95   Resp (!) 98   Wt 138 lb (62.6 kg)   LMP 02/18/2017 (Approximate)   BMI 23.69 kg/m  Vitals:   03/18/17 1307  BP: 118/78  Pulse: 95  Resp: (!) 98  Weight: 138 lb (62.6 kg)     Physical Exam  Constitutional:  She is oriented to person, place, and time. She appears well-developed and well-nourished.  Genitourinary: No labial fusion. There is rash, tenderness and lesion on the right labia. There is no injury on the right labia. There is no rash, tenderness, lesion or injury on the left labia. There is erythema in the vagina. No tenderness or bleeding in the vagina. No foreign body in the vagina. No signs of injury around the vagina. Vaginal  discharge found.  Genitourinary Comments: There is a cluster of herpetic appearing lesions on the right labia that are tender to palpation. Swabbed today for viral culture.  Neurological: She is alert and oriented to person, place, and time.  Skin: Skin is warm and dry.  Psychiatric: She has a normal mood and affect. Her behavior is normal.        Assessment & Plan:     1. Vaginal discharge  Will send in diflucan today and update as needed for possible BV.   - fluconazole (DIFLUCAN) 150 MG tablet; Take one pill today. Take second pill if symptomatic in three days.  Dispense: 2 tablet; Refill: 0 - NuSwab Vaginitis (VG)  2. Genital lesion, female  Do think her lesions appear herpetic. Swabbed today. Will treat empirically below and await viral vulture. Advised about not having sex during appearance of lesions and using protection.   - Herpes simplex virus culture - valACYclovir (VALTREX) 1000 MG tablet; Take 1 tablet (1,000 mg total) by mouth 2 (two) times daily.  Dispense: 20 tablet; Refill: 0  Return if symptoms worsen or fail to improve.  The entirety of the information documented in the History of Present Illness, Review of Systems and Physical Exam were personally obtained by me. Portions of this information were initially documented by Ashley Royalty, CMA and reviewed by me for thoroughness and accuracy.         Trinna Post, PA-C  [facility]

## 2017-03-18 NOTE — Patient Instructions (Signed)
Sexually Transmitted Disease  A sexually transmitted disease (STD) is a disease or infection that may be passed (transmitted) from person to person, usually during sexual activity. This may happen by way of saliva, semen, blood, vaginal mucus, or urine. Common STDs include:   Gonorrhea.   Chlamydia.   Syphilis.   HIV and AIDS.   Genital herpes.   Hepatitis B and C.   Trichomonas.   Human papillomavirus (HPV).   Pubic lice.   Scabies.   Mites.   Bacterial vaginosis.    What are the causes?  An STD may be caused by bacteria, a virus, or parasites. STDs are often transmitted during sexual activity if one person is infected. However, they may also be transmitted through nonsexual means. STDs may be transmitted after:   Sexual intercourse with an infected person.   Sharing sex toys with an infected person.   Sharing needles with an infected person or using unclean piercing or tattoo needles.   Having intimate contact with the genitals, mouth, or rectal areas of an infected person.   Exposure to infected fluids during birth.    What are the signs or symptoms?  Different STDs have different symptoms. Some people may not have any symptoms. If symptoms are present, they may include:   Painful or bloody urination.   Pain in the pelvis, abdomen, vagina, anus, throat, or eyes.   A skin rash, itching, or irritation.   Growths, ulcerations, blisters, or sores in the genital and anal areas.   Abnormal vaginal discharge with or without bad odor.   Penile discharge in men.   Fever.   Pain or bleeding during sexual intercourse.   Swollen glands in the groin area.   Yellow skin and eyes (jaundice). This is seen with hepatitis.   Swollen testicles.   Infertility.   Sores and blisters in the mouth.    How is this diagnosed?  To make a diagnosis, your health care provider may:   Take a medical history.   Perform a physical exam.   Take a sample of any discharge to examine.   Swab the throat, cervix,  opening to the penis, rectum, or vagina for testing.   Test a sample of your first morning urine.   Perform blood tests.   Perform a Pap test, if this applies.   Perform a colposcopy.   Perform a laparoscopy.    How is this treated?  Treatment depends on the STD. Some STDs may be treated but not cured.   Chlamydia, gonorrhea, trichomonas, and syphilis can be cured with antibiotic medicine.   Genital herpes, hepatitis, and HIV can be treated, but not cured, with prescribed medicines. The medicines lessen symptoms.   Genital warts from HPV can be treated with medicine or by freezing, burning (electrocautery), or surgery. Warts may come back.   HPV cannot be cured with medicine or surgery. However, abnormal areas may be removed from the cervix, vagina, or vulva.   If your diagnosis is confirmed, your recent sexual partners need treatment. This is true even if they are symptom-free or have a negative culture or evaluation. They should not have sex until their health care providers say it is okay.   Your health care provider may test you for infection again 3 months after treatment.    How is this prevented?  Take these steps to reduce your risk of getting an STD:   Use latex condoms, dental dams, and water-soluble lubricants during sexual activity. Do not use   petroleum jelly or oils.   Avoid having multiple sex partners.   Do not have sex with someone who has other sex partners.   Do not have sex with anyone you do not know or who is at high risk for an STD.   Avoid risky sex practices that can break your skin.   Do not have sex if you have open sores on your mouth or skin.   Avoid drinking too much alcohol or taking illegal drugs. Alcohol and drugs can affect your judgment and put you in a vulnerable position.   Avoid engaging in oral and anal sex acts.   Get vaccinated for HPV and hepatitis. If you have not received these vaccines in the past, talk to your health care provider about whether one or  both might be right for you.   If you are at risk of being infected with HIV, it is recommended that you take a prescription medicine daily to prevent HIV infection. This is called pre-exposure prophylaxis (PrEP). You are considered at risk if:  ? You are a man who has sex with other men (MSM).  ? You are a heterosexual man or woman and are sexually active with more than one partner.  ? You take drugs by injection.  ? You are sexually active with a partner who has HIV.   Talk with your health care provider about whether you are at high risk of being infected with HIV. If you choose to begin PrEP, you should first be tested for HIV. You should then be tested every 3 months for as long as you are taking PrEP.    Contact a health care provider if:   See your health care provider.   Tell your sexual partner(s). They should be tested and treated for any STDs.   Do not have sex until your health care provider says it is okay.  Get help right away if:  Contact your health care provider right away if:   You have severe abdominal pain.   You are a man and notice swelling or pain in your testicles.   You are a woman and notice swelling or pain in your vagina.    This information is not intended to replace advice given to you by your health care provider. Make sure you discuss any questions you have with your health care provider.  Document Released: 11/03/2002 Document Revised: 03/02/2016 Document Reviewed: 03/03/2013  Elsevier Interactive Patient Education  2018 [employer]

## 2017-03-20 ENCOUNTER — Telehealth: Payer: Self-pay | Admitting: Physician Assistant

## 2017-03-20 NOTE — Telephone Encounter (Signed)
Patient called office back, I advised her of message below.Patient reports that she has been taking Valtrex since Monday and is still having vaginal pain NOT pelvic. Patient denies fever, nausea or vomiting and states that she has been taking otc Ibuprofen for pain relief. KW

## 2017-03-20 NOTE — Telephone Encounter (Signed)
Called patient, let her know we added G&C. Continue warm sitz baths. Can use ice pack wrapped in towel. Will let her know of results. Just FYI.

## 2017-03-20 NOTE — Telephone Encounter (Signed)
Pt was in Monday and was put on a pill for vaginal infection.  She said it was not helping that she is in a lot of vaginal pain still.  Please advise  [phone]  thanks teri

## 2017-03-20 NOTE — Telephone Encounter (Signed)
Has she taken her valtrex as well? Is she having just vaginal pain or is she having pelvic pain, fever, nausea, vomiting? The pain could be irritation from the lesions. Recommend we call [facility] and see if we can add Gonorrhea and chlamydia to swab. If not, would recommend coming in to reswab.

## 2017-03-21 ENCOUNTER — Other Ambulatory Visit: Payer: Self-pay | Admitting: Physician Assistant

## 2017-03-22 LAB — HERPES SIMPLEX VIRUS CULTURE

## 2017-03-25 ENCOUNTER — Telehealth: Payer: Self-pay | Admitting: Physician Assistant

## 2017-03-25 NOTE — Telephone Encounter (Signed)
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 She is going to call back for f/u visit along with her partner.

## 2017-03-28 ENCOUNTER — Encounter: Payer: Self-pay | Admitting: Physician Assistant

## 2017-03-29 ENCOUNTER — Encounter: Payer: Self-pay | Admitting: Physician Assistant

## 2017-03-29 ENCOUNTER — Ambulatory Visit (INDEPENDENT_AMBULATORY_CARE_PROVIDER_SITE_OTHER): Payer: BC Managed Care – PPO | Admitting: Physician Assistant

## 2017-03-29 VITALS — BP 102/68 | HR 94 | Wt 135.0 lb

## 2017-03-29 DIAGNOSIS — A6000 Herpesviral infection of urogenital system, unspecified: Secondary | ICD-10-CM | POA: Diagnosis not present

## 2017-03-29 NOTE — Patient Instructions (Signed)
Genital Herpes Genital herpes is a common sexually transmitted infection (STI) that is caused by a virus. The virus spreads from person to person through sexual contact. Infection can cause itching, blisters, and sores around the genitals or rectum. Symptoms may last several days and then go away This is called an outbreak. However, the virus remains in your body, so you may have more outbreaks in the future. The time between outbreaks varies and can be months or years. Genital herpes affects men and women. It is particularly concerning for pregnant women because the virus can be passed to the baby during delivery and can cause serious problems. Genital herpes is also a concern for people who have a weak disease-fighting (immune) system. What are the causes? This condition is caused by the herpes simplex virus (HSV) type 1 or type 2. The virus may spread through:  Sexual contact with an infected person, including vaginal, anal, and oral sex.  Contact with fluid from a herpes sore.  The skin. This means that you can get herpes from an infected partner even if he or she does not have a visible sore or does not know that he or she is infected. What increases the risk? You are more likely to develop this condition if:  You have sex with many partners.  You do not use latex condoms during sex. What are the signs or symptoms? Most people do not have symptoms (asymptomatic) or have mild symptoms that may be mistaken for other skin problems. Symptoms may include:  Small red bumps near the genitals, rectum, or mouth. These bumps turn into blisters and then turn into sores.  Flu-like symptoms, including:  Fever.  Body aches.  Swollen lymph nodes.  Headache.  Painful urination.  Pain and itching in the genital area or rectal area.  Vaginal discharge.  Tingling or shooting pain in the legs and buttocks. Generally, symptoms are more severe and last longer during the first (primary)  outbreak. Flu-like symptoms are also more common during the primary outbreak. How is this diagnosed? Genital herpes may be diagnosed based on:  A physical exam.  Your medical history.  Blood tests.  A test of a fluid sample (culture) from an open sore. How is this treated? There is no cure for this condition, but treatment with antiviral medicines that are taken by mouth (orally) can do the following:  Speed up healing and relieve symptoms.  Help to reduce the spread of the virus to sexual partners.  Limit the chance of future outbreaks, or make future outbreaks shorter.  Lessen symptoms of future outbreaks. Your health care provider may also recommend pain relief medicines, such as aspirin or ibuprofen. Follow these instructions at home: Sexual activity   Do not have sexual contact during active outbreaks.  Practice safe sex. Latex condoms and female condoms may help prevent the spread of the herpes virus. General instructions   Keep the affected areas dry and clean.  Take over-the-counter and prescription medicines only as told by your health care provider.  Avoid rubbing or touching blisters and sores. If you do touch blisters or sores:  Wash your hands thoroughly with soap and water.  Do not touch your eyes afterward.  To help relieve pain or itching, you may take the following actions as directed by your health care provider:  Apply a cold, wet cloth (cold compress) to affected areas 4-6 times a day.  Apply a substance that protects your skin and reduces bleeding (astringent).  Apply a   gel that helps relieve pain around sores (lidocaine gel).  Take a warm, shallow bath that cleans the genital area (sitz bath).  Keep all follow-up visits as told by your health care provider. This is important. How is this prevented?  Use condoms. Although anyone can get genital herpes during sexual contact, even with the use of a condom, a condom can provide some  protection.  Avoid having multiple sexual partners.  Talk with your sexual partner about any symptoms either of you may have. Also, talk with your partner about any history of STIs.  Get tested for STIs before you have sex. Ask your partner to do the same.  Do not have sexual contact if you have symptoms of genital herpes. Contact a health care provider if:  Your symptoms are not improving with medicine.  Your symptoms return.  You have new symptoms.  You have a fever.  You have abdominal pain.  You have redness, swelling, or pain in your eye.  You notice new sores on other parts of your body.  You are a woman and experience bleeding between menstrual periods.  You have had herpes and you become pregnant or plan to become pregnant. Summary  Genital herpes is a common sexually transmitted infection (STI) that is caused by the herpes simplex virus (HSV) type 1 or type 2.  These viruses are most often spread through sexual contact with an infected person.  You are more likely to develop this condition if you have sex with many partners or you have unprotected sex.  Most people do not have symptoms (asymptomatic) or have mild symptoms that may be mistaken for other skin problems. Symptoms occur as outbreaks that may happen months or years apart.  There is no cure for this condition, but treatment with oral antiviral medicines can reduce symptoms, reduce the chance of spreading the virus to a partner, prevent future outbreaks, or shorten future outbreaks. This information is not intended to replace advice given to you by your health care provider. Make sure you discuss any questions you have with your health care provider. Document Released: 08/10/2000 Document Revised: 07/13/2016 Document Reviewed: 07/13/2016 Elsevier Interactive Patient Education  2017 Elsevier Inc.  

## 2017-03-29 NOTE — Progress Notes (Signed)
       Patient: Shelley Walton Female    DOB: 1996-08-18   21 y.o.   MRN: [id number] Visit Date: 03/29/2017  Today's Provider: Trinna Post, PA-C   Chief Complaint  Patient presents with  . SEXUALLY TRANSMITTED DISEASE   Subjective:      Shelley Walton is a 21 y/o woman presenting today with her boyfriend Shelley Walton for follow up of test results. She was here about a week ago with c/o vaginal lesions that have since resolved with Valtrex. HSV culture came back positive for HSV-1. Concern of remaining lesions today.  Exposure to STD   The problem has been resolved.       No Known Allergies   Current Outpatient Prescriptions:  .  ADVAIR DISKUS 250-50 MCG/DOSE AEPB, INHALE 1 PUFF INTO THE LUNGS TWICE DAILY, Disp: 60 each, Rfl: 5 .  desogestrel-ethinyl estradiol (APRI,EMOQUETTE,SOLIA) 0.15-30 MG-MCG tablet, Take 1 tablet by mouth daily., Disp: 3 Package, Rfl: 4 .  loratadine (CLARITIN) 10 MG tablet, Take 1 tablet (10 mg total) by mouth daily., Disp: 30 tablet, Rfl: 11 .  montelukast (SINGULAIR) 10 MG tablet, , Disp: , Rfl:  .  PROAIR HFA 108 (90 Base) MCG/ACT inhaler, , Disp: , Rfl:   Review of Systems  Constitutional: Negative.   Genitourinary: Negative.     Social History  Substance Use Topics  . Smoking status: Never Smoker  . Smokeless tobacco: Never Used  . Alcohol use No   Objective:   BP 102/68 (BP Location: Right Arm, Patient Position: Sitting, Cuff Size: Normal)   Pulse 94   Wt 135 lb (61.2 kg)   SpO2 97%   BMI 23.17 kg/m  Vitals:   03/29/17 1112  BP: 102/68  Pulse: 94  SpO2: 97%  Weight: 135 lb (61.2 kg)     Physical Exam  Constitutional: She is oriented to person, place, and time. She appears well-developed and well-nourished.  Genitourinary: There is no rash, tenderness, lesion or injury on the right labia. There is no rash, tenderness, lesion or injury on the left labia.  Genitourinary Comments: Lesions resolved in comparison to prior exam. No  apparent ulcerative lesions.  Neurological: She is alert and oriented to person, place, and time.  Skin: Skin is warm and dry.  Psychiatric: She has a normal mood and affect. Her behavior is normal.        Assessment & Plan:     1. Genital herpes simplex type 1 infection  Talked about safe sexual practices including abstaining from sex during outbreaks, using condoms during each encounter, and using daily antiviral prophylaxis. Patient expresses understanding. She wants to wait on daily prophylaxis and opts for spot treatment right now. Knows to call at first sign of symptoms. Call with any questions.  Return if symptoms worsen or fail to improve.  The entirety of the information documented in the History of Present Illness, Review of Systems and Physical Exam were personally obtained by me. Portions of this information were initially documented by Ashley Royalty, CMA and reviewed by me for thoroughness and accuracy.          Trinna Post, PA-C  [facility]

## 2017-04-05 LAB — NUSWAB VAGINITIS (VG)
Candida albicans, NAA: POSITIVE — AB
Candida glabrata, NAA: NEGATIVE
Trich vag by NAA: NEGATIVE

## 2017-04-11 ENCOUNTER — Telehealth: Payer: Self-pay

## 2017-04-11 NOTE — Telephone Encounter (Signed)
Pt advised.   Thanks,   -Heavin Sebree  

## 2017-04-11 NOTE — Telephone Encounter (Signed)
-----   Message from Trinna Post, [place] sent at 04/05/2017 11:44 AM EDT ----- NuSwab resulted positive for yeast, which patient was treated for. Negative for BV, Gonorrhea, chlamydia, and trich.

## 2017-04-13 LAB — SPECIMEN STATUS REPORT

## 2017-04-13 LAB — GC/CHLAMYDIA PROBE AMP
Chlamydia trachomatis, NAA: NEGATIVE
Neisseria gonorrhoeae by PCR: NEGATIVE

## 2017-04-16 ENCOUNTER — Other Ambulatory Visit: Payer: Self-pay | Admitting: Physician Assistant

## 2017-04-16 DIAGNOSIS — Z30011 Encounter for initial prescription of contraceptive pills: Secondary | ICD-10-CM

## 2017-04-24 ENCOUNTER — Encounter: Payer: Self-pay | Admitting: Physician Assistant

## 2017-04-24 DIAGNOSIS — A6000 Herpesviral infection of urogenital system, unspecified: Secondary | ICD-10-CM

## 2017-04-24 MED ORDER — VALACYCLOVIR HCL 1 G PO TABS: ORAL_TABLET | ORAL | 0 refills | Status: DC

## 2017-04-24 NOTE — Telephone Encounter (Signed)
Can we please call patient and tell her I sent in Valtrex? She should take 1g daily for five days. I have sent her in 30 tablets so she has some in case of future episodes.

## 2017-07-13 ENCOUNTER — Other Ambulatory Visit: Payer: Self-pay | Admitting: Physician Assistant

## 2017-07-13 ENCOUNTER — Encounter: Payer: Self-pay | Admitting: Physician Assistant

## 2017-07-13 DIAGNOSIS — N898 Other specified noninflammatory disorders of vagina: Secondary | ICD-10-CM

## 2017-07-15 NOTE — Telephone Encounter (Signed)
Pharmacy requesting refills. Thanks!  

## 2017-09-25 ENCOUNTER — Other Ambulatory Visit: Payer: Self-pay | Admitting: Physician Assistant

## 2017-09-25 DIAGNOSIS — J45909 Unspecified asthma, uncomplicated: Secondary | ICD-10-CM

## 2017-12-02 ENCOUNTER — Ambulatory Visit: Payer: BC Managed Care – PPO | Admitting: Physician Assistant

## 2017-12-02 ENCOUNTER — Encounter: Payer: Self-pay | Admitting: Physician Assistant

## 2017-12-02 VITALS — BP 116/72 | HR 96 | Temp 98.6°F | Resp 16 | Wt 149.0 lb

## 2017-12-02 DIAGNOSIS — R05 Cough: Secondary | ICD-10-CM

## 2017-12-02 DIAGNOSIS — R062 Wheezing: Secondary | ICD-10-CM | POA: Diagnosis not present

## 2017-12-02 DIAGNOSIS — R059 Cough, unspecified: Secondary | ICD-10-CM

## 2017-12-02 DIAGNOSIS — J45909 Unspecified asthma, uncomplicated: Secondary | ICD-10-CM

## 2017-12-02 MED ORDER — PREDNISONE 10 MG PO TABS: 10.0000 mg | ORAL_TABLET | Freq: Two times a day (BID) | ORAL | 0 refills | Status: AC

## 2017-12-02 NOTE — Progress Notes (Signed)
[place]  Chief Complaint  Patient presents with  . URI    Started Friday    Subjective:    Patient ID: Shelley Walton, female    DOB: 08-20-1996, 22 y.o.   MRN: [id number]  Upper Respiratory Infection: Shelley Walton is a22 y.o. female with a past medical history significant for Asthma complaining of symptoms of a URI, possible sinusitis. Symptoms include congestion, cough and plugged sensation in the left ear. Onset of symptoms was 3 days ago, gradually worsening since that time. She also c/o cough described as nonproductive, nasal congestion, post nasal drip, shortness of breath and sinus pressure for the past 3 days .  She is drinking plenty of fluids. Evaluation to date: none. Treatment to date: antihistamines, cough suppressants and decongestants. The treatment has provided no.   Review of Systems  Constitutional: Positive for fatigue. Negative for activity change, appetite change, chills, diaphoresis, fever and unexpected weight change.  HENT: Positive for congestion, postnasal drip, sinus pressure and sinus pain. Negative for ear discharge, ear pain, rhinorrhea, sneezing, sore throat, tinnitus, trouble swallowing and voice change.   Eyes: Positive for discharge and itching. Negative for photophobia, pain, redness and visual disturbance.  Respiratory: Positive for cough, chest tightness and shortness of breath. Negative for apnea, choking, wheezing and stridor.   Gastrointestinal: Positive for nausea. Negative for abdominal distention, abdominal pain, anal bleeding, blood in stool, constipation, diarrhea, rectal pain and vomiting.  Musculoskeletal: Negative.   Neurological: Positive for headaches. Negative for dizziness and light-headedness.       Objective:   BP 116/72 (BP Location: Right Arm, Patient Position: Sitting, Cuff Size: Normal)   Pulse 96   Temp 98.6 F (37 C) (Oral)   Resp 16   Wt 149 lb (67.6 kg)   LMP 11/01/2017    SpO2 95%   BMI 25.58 kg/m   Patient Active Problem List   Diagnosis Date Noted  . 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 Contraception, generic surveillance 06/20/2015  . History of chlamydia 06/09/2015  . Acne 02/24/2015  . Asthma 02/24/2015    Outpatient Encounter Medications as of 12/02/2017  Medication Sig  . ADVAIR DISKUS 250-50 MCG/DOSE AEPB INHALE 1 PUFF INTO THE LUNGS TWICE DAILY  . ISIBLOOM 0.15-30 MG-MCG tablet TAKE 1 TABLET BY MOUTH DAILY  . loratadine (CLARITIN) 10 MG tablet Take 1 tablet (10 mg total) by mouth daily.  . montelukast (SINGULAIR) 10 MG tablet   . PROAIR HFA 108 (90 Base) MCG/ACT inhaler   . valACYclovir (VALTREX) 1000 MG tablet Take 1g once daily for five days at first sign of outbreak.  . fluconazole (DIFLUCAN) 150 MG tablet TAKE 1 TABLET BY MOUTH TODAY. TAKE SECOND TABLET BY MOUTH IF SYMPTOMATIC IN THREE DAYS (Patient not taking: Reported on 12/02/2017)   No facility-administered encounter medications on file as of 12/02/2017.     No Known Allergies     Physical Exam  Constitutional: She is oriented to person, place, and time. She appears well-developed and well-nourished.  HENT:  Right Ear: External ear normal.  Left Ear: External ear normal.  Neck: Neck supple.  Cardiovascular: Normal rate and regular rhythm.  Pulmonary/Chest: Effort normal. She has wheezes.  Slight expiratory wheezes.  Lymphadenopathy:    She has no cervical adenopathy.  Neurological: She is alert and oriented to person, place, and time.  Skin: Skin is warm and dry.  Psychiatric: She has a normal mood and affect. Her behavior is  normal.       Assessment & Plan:  1. Wheezing  - predniSONE (DELTASONE) 10 MG tablet; Take 1 tablet (10 mg total) by mouth 2 (two) times daily with a meal for 5 days.  Dispense: 10 tablet; Refill: 0  2. Cough   3. Uncomplicated asthma, unspecified asthma severity, unspecified whether persistent  Use albuterol inhaler Q6H for the  next three days.  Return in about 5 months (around 05/04/2018) for CPE with PAP.  The entirety of the information documented in the History of Present Illness, Review of Systems and Physical Exam were personally obtained by me. Portions of this information were initially documented by Ashley Royalty, CMA and reviewed by me for thoroughness and accuracy.

## 2017-12-02 NOTE — Patient Instructions (Signed)
Bronchospasm, Adult Bronchospasm is a tightening of the airways going into the lungs. During an episode, it may be harder to breathe. You may cough, and you may make a whistling sound when you breathe (wheeze). This condition often affects people with asthma. What are the causes? This condition is caused by swelling and irritation in the airways. It can be triggered by:  An infection (common).  Seasonal allergies.  An allergic reaction.  Exercise.  Irritants. These include pollution, cigarette smoke, strong odors, aerosol sprays, and paint fumes.  Weather changes. Winds increase molds and pollens in the air. Cold air may cause swelling.  Stress and emotional upset.  What are the signs or symptoms? Symptoms of this condition include:  Wheezing. If the episode was triggered by an allergy, wheezing may start right away or hours later.  Nighttime coughing.  Frequent or severe coughing with a simple cold.  Chest tightness.  Shortness of breath.  Decreased ability to exercise.  How is this diagnosed? This condition is usually diagnosed with a review of your medical history and a physical exam. Tests, such as lung function tests, are sometimes done to look for other conditions. The need for a chest X-ray depends on where the wheezing occurs and whether it is the first time you have wheezed. How is this treated? This condition may be treated with:  Inhaled medicines. These open up the airways and help you breathe. They can be taken with an inhaler or a nebulizer device.  Corticosteroid medicines. These may be given for severe bronchospasm, usually when it is associated with asthma.  Avoiding triggers, such as irritants, infection, or allergies.  Follow these instructions at home: Medicines  Take over-the-counter and prescription medicines only as told by your health care provider.  If you need to use an inhaler or nebulizer to take your medicine, ask your health care  provider to explain how to use it correctly. If you were given a spacer, always use it with your inhaler. Lifestyle  Reduce the number of triggers in your home. To do this: ? Change your heating and air conditioning filter at least once a month. ? Limit your use of fireplaces and wood stoves. ? Do not smoke. Do not allow smoking in your home. ? Avoid using perfumes and fragrances. ? Get rid of pests, such as roaches and mice, and their droppings. ? Remove any mold from your home. ? Keep your house clean and dust free. Use unscented cleaning products. ? Replace carpet with wood, tile, or vinyl flooring. Carpet can trap dander and dust. ? Use allergy-proof pillows, mattress covers, and box spring covers. ? Wash bed sheets and blankets every week in hot water. Dry them in a dryer. ? Use blankets that are made of polyester or cotton. ? Wash your hands often. ? Do not allow pets in your bedroom.  Avoid breathing in cold air when you exercise. General instructions  Have a plan for seeking medical care. Know when to call your health care provider and local emergency services, and where to get emergency care.  Stay up to date on your immunizations.  When you have an episode of bronchospasm, stay calm. Try to relax and breathe more slowly.  If you have asthma, make sure you have an asthma action plan.  Keep all follow-up visits as told by your health care provider. This is important. Contact a health care provider if:  You have muscle aches.  You have chest pain.  The mucus that you   cough up (sputum) changes from clear or white to yellow, green, gray, or bloody.  You have a fever.  Your sputum gets thicker. Get help right away if:  Your wheezing and coughing get worse, even after you take your prescribed medicines.  It gets even harder to breathe.  You develop severe chest pain. Summary  Bronchospasm is a tightening of the airways going into the lungs.  During an episode of  bronchospasm, you may have a harder time breathing. You may cough and make a whistling sound when you breathe (wheeze).  Avoid exposure to triggers such as smoke, dust, mold, animal dander, and fragrances.  When you have an episode of bronchospasm, stay calm. Try to relax and breathe more slowly. This information is not intended to replace advice given to you by your health care provider. Make sure you discuss any questions you have with your health care provider. Document Released: 08/16/2003 Document Revised: 08/09/2016 Document Reviewed: 08/09/2016 Elsevier Interactive Patient Education  2017 Elsevier Inc.  

## 2017-12-14 ENCOUNTER — Encounter: Payer: Self-pay | Admitting: Physician Assistant

## 2017-12-14 ENCOUNTER — Other Ambulatory Visit: Payer: Self-pay | Admitting: Physician Assistant

## 2017-12-14 DIAGNOSIS — N898 Other specified noninflammatory disorders of vagina: Secondary | ICD-10-CM

## 2017-12-18 ENCOUNTER — Other Ambulatory Visit: Payer: Self-pay | Admitting: Physician Assistant

## 2017-12-18 DIAGNOSIS — N898 Other specified noninflammatory disorders of vagina: Secondary | ICD-10-CM

## 2017-12-18 NOTE — Telephone Encounter (Signed)
[facility] faxed a refill request for the following medication. Thanks CC  fluconazole (DIFLUCAN) 150 MG tablet

## 2017-12-24 ENCOUNTER — Other Ambulatory Visit: Payer: Self-pay | Admitting: Physician Assistant

## 2017-12-24 DIAGNOSIS — Z30011 Encounter for initial prescription of contraceptive pills: Secondary | ICD-10-CM

## 2018-01-29 ENCOUNTER — Other Ambulatory Visit: Payer: Self-pay | Admitting: Physician Assistant

## 2018-01-29 DIAGNOSIS — J45909 Unspecified asthma, uncomplicated: Secondary | ICD-10-CM

## 2018-02-14 ENCOUNTER — Other Ambulatory Visit: Payer: Self-pay | Admitting: Physician Assistant

## 2018-02-14 DIAGNOSIS — Z30011 Encounter for initial prescription of contraceptive pills: Secondary | ICD-10-CM

## 2018-02-17 ENCOUNTER — Other Ambulatory Visit: Payer: Self-pay | Admitting: Physician Assistant

## 2018-02-17 DIAGNOSIS — Z30011 Encounter for initial prescription of contraceptive pills: Secondary | ICD-10-CM

## 2018-02-17 MED ORDER — DESOGESTREL-ETHINYL ESTRADIOL 0.15-30 MG-MCG PO TABS: 1.0000 | ORAL_TABLET | Freq: Every day | ORAL | 0 refills | Status: DC

## 2018-02-17 NOTE — Telephone Encounter (Signed)
[facility] faxed a refill request for the following medication.  Thanks CC  ISIBLOOM 0.15-30 MG-MCG tablet

## 2018-03-03 ENCOUNTER — Other Ambulatory Visit: Payer: Self-pay | Admitting: Physician Assistant

## 2018-03-03 DIAGNOSIS — N898 Other specified noninflammatory disorders of vagina: Secondary | ICD-10-CM

## 2018-03-17 ENCOUNTER — Encounter: Payer: Self-pay | Admitting: Physician Assistant

## 2018-03-17 ENCOUNTER — Ambulatory Visit: Payer: BC Managed Care – PPO | Admitting: Physician Assistant

## 2018-03-17 VITALS — BP 112/62 | HR 84 | Temp 98.5°F | Resp 16 | Wt 153.0 lb

## 2018-03-17 DIAGNOSIS — R635 Abnormal weight gain: Secondary | ICD-10-CM

## 2018-03-17 DIAGNOSIS — Z789 Other specified health status: Secondary | ICD-10-CM | POA: Diagnosis not present

## 2018-03-17 DIAGNOSIS — IMO0001 Reserved for inherently not codable concepts without codable children: Secondary | ICD-10-CM

## 2018-03-17 DIAGNOSIS — N898 Other specified noninflammatory disorders of vagina: Secondary | ICD-10-CM | POA: Diagnosis not present

## 2018-03-17 DIAGNOSIS — R4586 Emotional lability: Secondary | ICD-10-CM

## 2018-03-17 DIAGNOSIS — R21 Rash and other nonspecific skin eruption: Secondary | ICD-10-CM

## 2018-03-17 MED ORDER — ETHYNODIOL DIAC-ETH ESTRADIOL 1-35 MG-MCG PO TABS: 1.0000 | ORAL_TABLET | Freq: Every day | ORAL | 4 refills | Status: DC

## 2018-03-17 NOTE — Patient Instructions (Signed)

## 2018-03-17 NOTE — Progress Notes (Signed)
Patient: Shelley Walton Female    DOB: 1996/04/16   22 y.o.   MRN: [id number] Visit Date: 03/17/2018  Today's Provider: Trinna Post, PA-C   Chief Complaint  Patient presents with  . Rash  . Contraception  . Vaginitis   Subjective:    Shelley Walton is a 22 y/o woman presenting today for rash, mood issues, vaginal discharge and weight gain.   She was at a yard sale this past weekend and has multiple red lesions on the backs of her legs and her back that are very itchy. Thinks they may be bug bites.   She also reports one year of mood swings and also weight gain. She reports having had a very stressful year. 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 She reports possibly overeating. Denies ever having been diagnosed with depression. She would like to change her birth control.   Reports that she gets monthly yeast infections. Believes they may be around the time of her period.   Rash  This is a new problem. The current episode started yesterday. The problem has been gradually worsening since onset. The rash is diffuse. The rash is characterized by redness and itchiness. She was exposed to nothing. Pertinent negatives include no anorexia, congestion, cough, diarrhea, eye pain, facial edema, fatigue, fever, joint pain, nail changes, rhinorrhea, shortness of breath, sore throat or vomiting. Past treatments include nothing.   Wt Readings from Last 3 Encounters:  03/17/18 153 lb (69.4 kg)  12/02/17 149 lb (67.6 kg)  03/29/17 135 lb (61.2 kg) (62 %, Z= 0.29)*   * Growth percentiles are based on CDC (Girls, 2-20 Years) data.       No Known Allergies   Current Outpatient Medications:  .  ADVAIR DISKUS 250-50 MCG/DOSE AEPB, USE ONE INHALATION TWICE DAILY, Disp: 60 each, Rfl: 5 .  desogestrel-ethinyl estradiol (ISIBLOOM) 0.15-30 MG-MCG tablet, Take 1 tablet by mouth daily., Disp: 56 tablet, Rfl: 0 .  loratadine (CLARITIN) 10 MG tablet, Take  1 tablet (10 mg total) by mouth daily., Disp: 30 tablet, Rfl: 11 .  montelukast (SINGULAIR) 10 MG tablet, , Disp: , Rfl:  .  PROAIR HFA 108 (90 Base) MCG/ACT inhaler, , Disp: , Rfl:  .  valACYclovir (VALTREX) 1000 MG tablet, Take 1g once daily for five days at first sign of outbreak., Disp: 30 tablet, Rfl: 0 .  ethynodiol-ethinyl estradiol (KELNOR 1/35) 1-35 MG-MCG tablet, Take 1 tablet by mouth daily., Disp: 3 Package, Rfl: 4 .  fluconazole (DIFLUCAN) 150 MG tablet, TAKE 1 TABLET BY MOUTH TODAY. TAKE SECOND TABLET BY MOUTH IF SYMPTOMATIC IN THREE DAYS, Disp: 2 tablet, Rfl: 0  Review of Systems  Constitutional: Negative.  Negative for fatigue and fever.  HENT: Negative for congestion, rhinorrhea and sore throat.   Eyes: Negative.  Negative for pain.  Respiratory: Negative.  Negative for cough and shortness of breath.   Cardiovascular: Negative.   Gastrointestinal: Negative.  Negative for anorexia, diarrhea and vomiting.  Genitourinary: Negative.   Musculoskeletal: Negative for joint pain.  Skin: Positive for rash. Negative for nail changes.  Neurological: Negative for dizziness, light-headedness and headaches.    Social History   Tobacco Use  . Smoking status: Never Smoker  . Smokeless tobacco: Never Used  Substance Use Topics  . Alcohol use: No   Objective:   BP 112/62 (BP Location: Right Arm, Patient Position: Sitting, Cuff Size: Normal)   Pulse  84   Temp 98.5 F (36.9 C) (Oral)   Resp 16   Wt 153 lb (69.4 kg)   BMI 26.26 kg/m  Vitals:   03/17/18 1309  BP: 112/62  Pulse: 84  Resp: 16  Temp: 98.5 F (36.9 C)  TempSrc: Oral  Weight: 153 lb (69.4 kg)     Physical Exam  Constitutional: She is oriented to person, place, and time. She appears well-developed and well-nourished.  Cardiovascular: Normal rate.  Pulmonary/Chest: Effort normal.  Neurological: She is alert and oriented to person, place, and time.  Skin: Skin is warm and dry. Rash noted. No erythema.    Scattered erythematous papules on dorsum of body.   Psychiatric: She has a normal mood and affect. Her behavior is normal.        Assessment & Plan:     1. Contraception  I will change this today. Please start new pack after placebo week. See #3.   - ethynodiol-ethinyl estradiol Marliss Coots 1/35) 1-35 MG-MCG tablet; Take 1 tablet by mouth daily.  Dispense: 3 Package; Refill: 4  2. Rash  Does look like bug bites or possible contact reaction. Advised topical Hydrocortisone cream to lesions.  3. Mood swings  Discussed ongoing nature of mood issues. Suspect there may be larger issues here related to stress and think this is unlikely due to her birth control, which she has been on for at least a year prior to having these problems. Will change the birth control, however advised her we may need to regroup if the issue is unresolved. She has been thinking about going to counseling, advised about possible employee assistance program with her work or else we can refer her within the [place] system. If she wishes to pursue this, she may call back and I can place the referral.  4. Weight gain  Do not think this is likely due to OCP. Her weight has increased over the past year, though is still in a pretty fairly health range.   5. Vaginal discharge  Think this could be BV rather than yeast. Advised to either return to clinic during symptoms or we may test for BV/yeast at CPE when we do PAP. Patient agreeable.   Return in about 1 month (around 04/17/2018) for CPE.  The entirety of the information documented in the History of Present Illness, Review of Systems and Physical Exam were personally obtained by me. Portions of this information were initially documented by Ashley Royalty, CMA and reviewed by me for thoroughness and accuracy.   I have spent 25 minutes with this patient, >50% of which was spent on counseling and coordination of care.       Trinna Post, PA-C  [facility]

## 2018-05-02 ENCOUNTER — Encounter: Payer: Self-pay | Admitting: Physician Assistant

## 2018-05-02 ENCOUNTER — Ambulatory Visit (INDEPENDENT_AMBULATORY_CARE_PROVIDER_SITE_OTHER): Payer: BC Managed Care – PPO | Admitting: Physician Assistant

## 2018-05-02 ENCOUNTER — Other Ambulatory Visit (HOSPITAL_COMMUNITY)
Admission: RE | Admit: 2018-05-02 | Discharge: 2018-05-02 | Disposition: A | Discharge status: Home / Self Care | Payer: BC Managed Care – PPO | Source: Ambulatory Visit | Attending: Physician Assistant | Admitting: Physician Assistant

## 2018-05-02 VITALS — BP 116/80 | HR 87 | Temp 98.1°F | Resp 16 | Ht 64.0 in | Wt 159.0 lb

## 2018-05-02 DIAGNOSIS — Z0000 Encounter for general adult medical examination without abnormal findings: Secondary | ICD-10-CM

## 2018-05-02 DIAGNOSIS — Z114 Encounter for screening for human immunodeficiency virus [HIV]: Secondary | ICD-10-CM

## 2018-05-02 DIAGNOSIS — Z124 Encounter for screening for malignant neoplasm of cervix: Secondary | ICD-10-CM | POA: Diagnosis not present

## 2018-05-02 DIAGNOSIS — J45909 Unspecified asthma, uncomplicated: Secondary | ICD-10-CM

## 2018-05-02 DIAGNOSIS — Z131 Encounter for screening for diabetes mellitus: Secondary | ICD-10-CM

## 2018-05-02 DIAGNOSIS — Z13220 Encounter for screening for lipoid disorders: Secondary | ICD-10-CM | POA: Diagnosis not present

## 2018-05-02 DIAGNOSIS — Z130 Encounter for screening for diseases of the blood and blood-forming organs and certain disorders involving the immune mechanism: Secondary | ICD-10-CM | POA: Diagnosis not present

## 2018-05-02 DIAGNOSIS — Z3041 Encounter for surveillance of contraceptive pills: Secondary | ICD-10-CM

## 2018-05-02 DIAGNOSIS — Z1329 Encounter for screening for other suspected endocrine disorder: Secondary | ICD-10-CM

## 2018-05-02 NOTE — Progress Notes (Signed)
Patient: Shelley Walton, Female    DOB: 1996-07-17, 22 y.o.   MRN: [id number] Visit Date: 05/02/2018  Today's Provider: Trinna Post, PA-C   Chief Complaint  Patient presents with  . Annual Exam   Subjective:    Annual physical exam Shelley Walton is a 22 y.o. female who presents today for health maintenance and complete physical. She feels well. She reports exercising none. She reports she is sleeping well.  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   Wt Readings from Last 3 Encounters:  05/02/18 159 lb (72.1 kg)  03/17/18 153 lb (69.4 kg)  12/02/17 149 lb (67.6 kg)   Taking advair daily for asthma, doing well with this. No asthma exacerbations recently, usually gets on during the winter.  Doing well on new birth control.  -----------------------------------------------------------------   Review of Systems  Constitutional: Negative.   HENT: Negative.   Eyes: Negative.   Respiratory: Negative.   Cardiovascular: Negative.   Gastrointestinal: Negative.   Endocrine: Negative.   Genitourinary: Negative.   Musculoskeletal: Negative.   Skin: Negative.   Allergic/Immunologic: Negative.   Neurological: Negative.   Hematological: Negative.   Psychiatric/Behavioral: Negative.     Social History      She  reports that she has never smoked. She has never used smokeless tobacco. She reports that she does not drink alcohol or use drugs.       Social History   Socioeconomic History  . Marital status: Single    Spouse name: Not on file  . Number of children: Not on file  . Years of education: Not on file  . Highest education level: Not on file  Occupational History  . Occupation: Ship broker    Comment: Planning to go to [employer] in fall for [employer].   . Occupation: works at [employer]  . Financial resource strain: Not on file  . Food insecurity:    Worry: Not on file    Inability: Not on file  . Transportation needs:    Medical: Not on file    Non-medical:  Not on file  Tobacco Use  . Smoking status: Never Smoker  . Smokeless tobacco: Never Used  Substance and Sexual Activity  . Alcohol use: No  . Drug use: No  . Sexual activity: Never    Birth control/protection: Abstinence  Lifestyle  . Physical activity:    Days per week: Not on file    Minutes per session: Not on file  . Stress: Not on file  Relationships  . Social connections:    Talks on phone: Not on file    Gets together: Not on file    Attends religious service: Not on file    Active member of club or organization: Not on file    Attends meetings of clubs or organizations: Not on file    Relationship status: Not on file  Other Topics Concern  . Not on file  Social History Narrative  . Not on file    Past Medical History:  Diagnosis Date  . Asthma    daily inhaler  . Mass of breast, right 09/2013     Patient Active Problem List   Diagnosis Date Noted  . 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 Contraception, generic surveillance 06/20/2015  . History of chlamydia 06/09/2015  . Acne 02/24/2015  . Asthma 02/24/2015    Past Surgical History:  Procedure Laterality Date  . MASS EXCISION Right 10/14/2013  Procedure: RIGHT BREAST MASS EXCISION;  Surgeon: Rolm Bookbinder, MD;  Location: [facility];  Service: General;  Laterality: Right;    Family History        Family Status  Relation Name Status  . Mother  Alive  . MGF  Alive  . Father  Alive  . MGM  (Not Specified)  . Mat Uncle  (Not Specified)  . Brother  Alive  . Brother  Alive        Her family history includes Breast cancer in her mother; Diabetes in her maternal grandfather and maternal grandmother; Healthy in her brother and brother; Heart disease in her maternal grandfather; Hypertension in her maternal grandfather; Multiple sclerosis in her maternal uncle and mother; Ovarian cancer in her maternal grandmother.      No Known Allergies   Current Outpatient  Medications:  .  ADVAIR DISKUS 250-50 MCG/DOSE AEPB, USE ONE INHALATION TWICE DAILY, Disp: 60 each, Rfl: 5 .  ethynodiol-ethinyl estradiol (KELNOR 1/35) 1-35 MG-MCG tablet, Take 1 tablet by mouth daily., Disp: 3 Package, Rfl: 4 .  loratadine (CLARITIN) 10 MG tablet, Take 1 tablet (10 mg total) by mouth daily., Disp: 30 tablet, Rfl: 11 .  PROAIR HFA 108 (90 Base) MCG/ACT inhaler, , Disp: , Rfl:  .  valACYclovir (VALTREX) 1000 MG tablet, Take 1g once daily for five days at first sign of outbreak., Disp: 30 tablet, Rfl: 0   Patient Care Team: Paulene Floor as PCP - General (Physician Assistant)      Objective:   Vitals: BP 116/80 (BP Location: Left Arm, Patient Position: Sitting, Cuff Size: Normal)   Pulse 87   Temp 98.1 F (36.7 C) (Oral)   Resp 16   Ht 5\' 4"  (1.626 m)   Wt 159 lb (72.1 kg)   SpO2 99%   BMI 27.29 kg/m    Vitals:   05/02/18 1549  BP: 116/80  Pulse: 87  Resp: 16  Temp: 98.1 F (36.7 C)  TempSrc: Oral  SpO2: 99%  Weight: 159 lb (72.1 kg)  Height: 5\' 4"  (1.626 m)     Physical Exam  Constitutional: She is oriented to person, place, and time. She appears well-developed and well-nourished.  Cardiovascular: Normal rate and regular rhythm.  Pulmonary/Chest: Effort normal and breath sounds normal.  Abdominal: Soft. Bowel sounds are normal.  Genitourinary: No labial fusion. There is no rash, tenderness, lesion or injury on the right labia. There is no rash, tenderness, lesion or injury on the left labia. Uterus is not deviated, not enlarged, not fixed and not tender. Cervix exhibits no motion tenderness, no discharge and no friability. Right adnexum displays no mass, no tenderness and no fullness. Left adnexum displays no mass, no tenderness and no fullness. No erythema, tenderness or bleeding in the vagina. No foreign body in the vagina. No signs of injury around the vagina. No vaginal discharge found.  Neurological: She is alert and oriented to person,  place, and time.  Skin: Skin is warm and dry.  Psychiatric: She has a normal mood and affect. Her behavior is normal.     Depression Screen PHQ 2/9 Scores 03/29/2017  PHQ - 2 Score 0  PHQ- 9 Score 3      Assessment & Plan:     Routine Health Maintenance and Physical Exam  Exercise Activities and Dietary recommendations Goals   None      There is no immunization history on file for this patient.  Health Maintenance  Topic  Date Due  . HIV Screening  04/06/2012  . TETANUS/TDAP  04/06/2016  . INFLUENZA VACCINE  03/27/2018  . PAP SMEAR  04/06/2018     Discussed health benefits of physical activity, and encouraged her to engage in regular exercise appropriate for her age and condition.     1. Annual physical exam  - Comprehensive Metabolic Panel (CMET) - Lipid Profile - TSH - CBC with Differential - HIV antibody (with reflex)  2. Cervical cancer screening  Also testing for yeast and BV as she has had issues.  - Cytology - PAP  3. Screening for deficiency anemia  - CBC with Differential  4. Screening cholesterol level  - Lipid Profile  5. Diabetes mellitus screening  - Comprehensive Metabolic Panel (CMET)  6. Encounter for screening for HIV  - HIV antibody (with reflex)  7. Thyroid disorder screening  - TSH  8. Uncomplicated asthma, unspecified asthma severity, unspecified whether persistent  Continue Advair and Proair.  9. Encounter for surveillance of contraceptive pills  Continue OCP.  Return in about 1 year (around 05/03/2019) for CPE .  The entirety of the information documented in the History of Present Illness, Review of Systems and Physical Exam were personally obtained by me. Portions of this information were initially documented by Lynford Humphrey, CMA and reviewed by me for thoroughness and accuracy.   --------------------------------------------------------------------    Trinna Post, PA-C  [facility]

## 2018-05-02 NOTE — Patient Instructions (Signed)

## 2018-05-05 LAB — CYTOLOGY - PAP
Bacterial vaginitis: POSITIVE — AB
Candida vaginitis: POSITIVE — AB
Chlamydia: NEGATIVE
Diagnosis: NEGATIVE
Neisseria Gonorrhea: NEGATIVE

## 2018-05-09 ENCOUNTER — Encounter: Payer: Self-pay | Admitting: Physician Assistant

## 2018-05-13 ENCOUNTER — Telehealth: Payer: Self-pay

## 2018-05-13 MED ORDER — METRONIDAZOLE 500 MG PO TABS: 500.0000 mg | ORAL_TABLET | Freq: Two times a day (BID) | ORAL | 0 refills | Status: DC

## 2018-05-13 MED ORDER — FLUCONAZOLE 150 MG PO TABS: 150.0000 mg | ORAL_TABLET | Freq: Once | ORAL | 0 refills | Status: AC

## 2018-05-13 NOTE — Telephone Encounter (Signed)
LMTCB

## 2018-05-13 NOTE — Telephone Encounter (Signed)
Pt returned call. Thanks TNP °

## 2018-05-13 NOTE — Telephone Encounter (Signed)
Patient advised. RX sent to [facility] pharmacy.  

## 2018-05-13 NOTE — Telephone Encounter (Signed)
-----   Message from Birdie Sons, MD sent at 05/13/2018  9:55 AM EDT ----- Pap is positive for yeast and bacterial vaginitis. Need to send in diflucan 150mg  x 1 for yeast. Bacterial vaginitis can either be treated with metronidazole 500mg  tablets bid for 7 days, or metronidazole cream qhs for 7 days, whichever she prefers.

## 2018-05-15 NOTE — Telephone Encounter (Signed)
Pap shows yeast and b.v. Please see telephone message regarding disposition.

## 2018-06-10 ENCOUNTER — Encounter: Payer: Self-pay | Admitting: Physician Assistant

## 2018-06-10 ENCOUNTER — Ambulatory Visit: Payer: BC Managed Care – PPO | Admitting: Physician Assistant

## 2018-06-10 VITALS — BP 110/80 | HR 78 | Temp 98.2°F | Resp 16 | Wt 157.0 lb

## 2018-06-10 DIAGNOSIS — B373 Candidiasis of vulva and vagina: Secondary | ICD-10-CM

## 2018-06-10 DIAGNOSIS — B3731 Acute candidiasis of vulva and vagina: Secondary | ICD-10-CM

## 2018-06-10 DIAGNOSIS — J0110 Acute frontal sinusitis, unspecified: Secondary | ICD-10-CM | POA: Diagnosis not present

## 2018-06-10 MED ORDER — AMOXICILLIN-POT CLAVULANATE 875-125 MG PO TABS: 1.0000 | ORAL_TABLET | Freq: Two times a day (BID) | ORAL | 0 refills | Status: DC

## 2018-06-10 MED ORDER — FLUCONAZOLE 150 MG PO TABS: ORAL_TABLET | ORAL | 0 refills | Status: DC

## 2018-06-10 MED ORDER — AMOXICILLIN-POT CLAVULANATE 875-125 MG PO TABS: 1.0000 | ORAL_TABLET | Freq: Two times a day (BID) | ORAL | 0 refills | Status: AC

## 2018-06-10 NOTE — Patient Instructions (Signed)

## 2018-06-10 NOTE — Progress Notes (Signed)
Patient: Shelley Walton Female    DOB: 12/14/1995   22 y.o.   MRN: [id number] Visit Date: 06/11/2018  Today's Provider: Trinna Post, PA-C   Chief Complaint  Patient presents with  . Cough   Subjective:    HPI   Patient here today c/o cough, congestion, sore throat and right ear pain, and headache x's 2 weeks. Patient reports she has tried several OTC meds including mucinex DM, sudafed, none have helped. She takes daily allergy medication. Denies pregnancy.        No Known Allergies   Current Outpatient Medications:  .  ADVAIR DISKUS 250-50 MCG/DOSE AEPB, USE ONE INHALATION TWICE DAILY, Disp: 60 each, Rfl: 5 .  ethynodiol-ethinyl estradiol (KELNOR 1/35) 1-35 MG-MCG tablet, Take 1 tablet by mouth daily., Disp: 3 Package, Rfl: 4 .  loratadine (CLARITIN) 10 MG tablet, Take 1 tablet (10 mg total) by mouth daily., Disp: 30 tablet, Rfl: 11 .  PROAIR HFA 108 (90 Base) MCG/ACT inhaler, , Disp: , Rfl:  .  amoxicillin-clavulanate (AUGMENTIN) 875-125 MG tablet, Take 1 tablet by mouth 2 (two) times daily for 10 days., Disp: 20 tablet, Rfl: 0 .  fluconazole (DIFLUCAN) 150 MG tablet, Take one pill on day 1. Take 2nd pill day 3 days later if it persists., Disp: 2 tablet, Rfl: 0 .  metroNIDAZOLE (FLAGYL) 500 MG tablet, Take 1 tablet (500 mg total) by mouth 2 (two) times daily. (Patient not taking: Reported on 06/10/2018), Disp: 14 tablet, Rfl: 0 .  valACYclovir (VALTREX) 1000 MG tablet, Take 1g once daily for five days at first sign of outbreak. (Patient not taking: Reported on 06/10/2018), Disp: 30 tablet, Rfl: 0  Review of Systems  Constitutional: Negative.   HENT: Positive for congestion, ear pain, rhinorrhea, sinus pressure, sinus pain and sore throat.   Respiratory: Positive for cough.   Neurological: Positive for headaches.    Social History   Tobacco Use  . Smoking status: Never Smoker  . Smokeless tobacco: Never Used  Substance Use Topics  . Alcohol use: No    Objective:   BP 110/80 (BP Location: Left Arm, Patient Position: Sitting, Cuff Size: Normal)   Pulse 78   Temp 98.2 F (36.8 C) (Oral)   Resp 16   Wt 157 lb (71.2 kg)   SpO2 97%   BMI 26.95 kg/m  Vitals:   06/10/18 1228  BP: 110/80  Pulse: 78  Resp: 16  Temp: 98.2 F (36.8 C)  TempSrc: Oral  SpO2: 97%  Weight: 157 lb (71.2 kg)     Physical Exam  Constitutional: She is oriented to person, place, and time. She appears well-developed and well-nourished. No distress.  HENT:  Right Ear: External ear normal.  Left Ear: External ear normal.  Nose: Right sinus exhibits maxillary sinus tenderness and frontal sinus tenderness. Left sinus exhibits maxillary sinus tenderness and frontal sinus tenderness.  Mouth/Throat: Oropharynx is clear and moist. No oropharyngeal exudate, posterior oropharyngeal edema or posterior oropharyngeal erythema.  Tms opaque bilaterally   Eyes: Conjunctivae are normal. Right eye exhibits no discharge. Left eye exhibits no discharge.  Neck: Neck supple.  Cardiovascular: Normal rate and regular rhythm.  Pulmonary/Chest: Effort normal and breath sounds normal.  Lymphadenopathy:    She has cervical adenopathy.  Neurological: She is alert and oriented to person, place, and time.  Skin: Skin is warm and dry. She is not diaphoretic.  Psychiatric: She has a normal mood and affect. Her behavior is normal.  Assessment & Plan:     1. Acute non-recurrent frontal sinusitis  - amoxicillin-clavulanate (AUGMENTIN) 875-125 MG tablet; Take 1 tablet by mouth 2 (two) times daily for 10 days.  Dispense: 20 tablet; Refill: 0  2. Vaginal candida  - fluconazole (DIFLUCAN) 150 MG tablet; Take one pill on day 1. Take 2nd pill day 3 days later if it persists.  Dispense: 2 tablet; Refill: 0  Return if symptoms worsen or fail to improve.  The entirety of the information documented in the History of Present Illness, Review of Systems and Physical Exam were  personally obtained by me. Portions of this information were initially documented by Lynford Humphrey, CMA and reviewed by me for thoroughness and accuracy.        Trinna Post, PA-C  [facility]

## 2018-06-12 ENCOUNTER — Telehealth: Payer: Self-pay | Admitting: Physician Assistant

## 2018-06-12 DIAGNOSIS — J0110 Acute frontal sinusitis, unspecified: Secondary | ICD-10-CM

## 2018-06-12 MED ORDER — AMOXICILLIN 875 MG PO TABS: 875.0000 mg | ORAL_TABLET | Freq: Two times a day (BID) | ORAL | 0 refills | Status: AC

## 2018-06-12 NOTE — Telephone Encounter (Signed)
I will change to amoxicillin. If diarrhea persists she has to bring in stool sample to be tested.

## 2018-06-12 NOTE — Telephone Encounter (Signed)
Patient was seen on Tuesday and given Augmentin. She is having harsh stomach cramps and A LOT of diarrhea.  Wants to know if it can be changed to a different antibiotic.  She uses [facility] in [place].

## 2018-06-12 NOTE — Addendum Note (Signed)
Addended by: Trinna Post on: 06/12/2018 04:56 PM   Modules accepted: Orders

## 2018-06-12 NOTE — Telephone Encounter (Signed)
How many episodes of diarrhea in one day? Are the stools completely unformed? Does she have fever, chills, nausea, vomiting? Is there blood in her stool? If so she would need to stop the antibiotic and bring in sample to test for c diff rather than changing it.

## 2018-06-12 NOTE — Telephone Encounter (Signed)
Patient reports diarrhea is after eating so about 3 times a day.

## 2018-07-17 ENCOUNTER — Encounter: Payer: Self-pay | Admitting: Physician Assistant

## 2018-07-17 DIAGNOSIS — A6000 Herpesviral infection of urogenital system, unspecified: Secondary | ICD-10-CM

## 2018-07-18 MED ORDER — VALACYCLOVIR HCL 1 G PO TABS: ORAL_TABLET | ORAL | 0 refills | Status: DC

## 2018-07-23 ENCOUNTER — Encounter: Payer: Self-pay | Admitting: Physician Assistant

## 2018-07-23 ENCOUNTER — Ambulatory Visit: Payer: BC Managed Care – PPO | Admitting: Physician Assistant

## 2018-07-23 VITALS — BP 110/80 | HR 80 | Temp 98.2°F | Resp 16 | Wt 161.0 lb

## 2018-07-23 DIAGNOSIS — J040 Acute laryngitis: Secondary | ICD-10-CM

## 2018-07-23 DIAGNOSIS — J029 Acute pharyngitis, unspecified: Secondary | ICD-10-CM | POA: Diagnosis not present

## 2018-07-23 MED ORDER — PREDNISONE 20 MG PO TABS: 20.0000 mg | ORAL_TABLET | Freq: Every day | ORAL | 0 refills | Status: AC

## 2018-07-23 NOTE — Patient Instructions (Signed)

## 2018-07-23 NOTE — Progress Notes (Signed)
Acute Office Visit  Subjective:    Patient ID: Shelley Walton, female    DOB: 11-23-1995, 22 y.o.   MRN: [id number]  Chief Complaint  Patient presents with  . URI    HPI Patient is in today for Upper Respiratory Infection: Patient complains of symptoms of a URI, possible sinusitis. Symptoms include congestion and cough. Onset of symptoms was 5 days ago, gradually worsening since that time. She also c/o productive cough with  yellow colored sputum for the past 1 day .  She is drinking plenty of fluids. Evaluation to date: none. Treatment to date: antihistamines and decongestants.     Past Medical History:  Diagnosis Date  . Asthma    daily inhaler  . Mass of breast, right 09/2013    Past Surgical History:  Procedure Laterality Date  . MASS EXCISION Right 10/14/2013   Procedure: RIGHT BREAST MASS EXCISION;  Surgeon: Rolm Bookbinder, MD;  Location: [facility];  Service: General;  Laterality: Right;    Family History  Problem Relation Age of Onset  . Breast cancer Mother   . Multiple sclerosis Mother   . Diabetes Maternal Grandfather   . Hypertension Maternal Grandfather   . Heart disease Maternal Grandfather   . Ovarian cancer Maternal Grandmother   . Diabetes Maternal Grandmother   . Multiple sclerosis Maternal Uncle   . Healthy Brother   . Healthy Brother     Social History   Socioeconomic History  . Marital status: Single    Spouse name: Not on file  . Number of children: Not on file  . Years of education: Not on file  . Highest education level: Not on file  Occupational History  . Occupation: Ship broker    Comment: Planning to go to [employer] in fall for [employer].   . Occupation: works at [employer]  . Financial resource strain: Not on file  . Food insecurity:    Worry: Not on file    Inability: Not on file  . Transportation needs:    Medical: Not on file    Non-medical: Not on file  Tobacco Use  . Smoking status: Never  Smoker  . Smokeless tobacco: Never Used  Substance and Sexual Activity  . Alcohol use: No  . Drug use: No  . Sexual activity: Never    Birth control/protection: Abstinence  Lifestyle  . Physical activity:    Days per week: Not on file    Minutes per session: Not on file  . Stress: Not on file  Relationships  . Social connections:    Talks on phone: Not on file    Gets together: Not on file    Attends religious service: Not on file    Active member of club or organization: Not on file    Attends meetings of clubs or organizations: Not on file    Relationship status: Not on file  . Intimate partner violence:    Fear of current or ex partner: Not on file    Emotionally abused: Not on file    Physically abused: Not on file    Forced sexual activity: Not on file  Other Topics Concern  . Not on file  Social History Narrative  . Not on file    Outpatient Medications Prior to Visit  Medication Sig Dispense Refill  . ADVAIR DISKUS 250-50 MCG/DOSE AEPB USE ONE INHALATION TWICE DAILY 60 each 5  . ethynodiol-ethinyl estradiol (KELNOR 1/35) 1-35 MG-MCG tablet Take  1 tablet by mouth daily. 3 Package 4  . loratadine (CLARITIN) 10 MG tablet Take 1 tablet (10 mg total) by mouth daily. 30 tablet 11  . PROAIR HFA 108 (90 Base) MCG/ACT inhaler     . valACYclovir (VALTREX) 1000 MG tablet Take 1g once daily for five days at first sign of outbreak. 30 tablet 0  . fluconazole (DIFLUCAN) 150 MG tablet Take one pill on day 1. Take 2nd pill day 3 days later if it persists. 2 tablet 0  . metroNIDAZOLE (FLAGYL) 500 MG tablet Take 1 tablet (500 mg total) by mouth 2 (two) times daily. (Patient not taking: Reported on 06/10/2018) 14 tablet 0   No facility-administered medications prior to visit.     No Known Allergies  ROS     Objective:    Physical Exam  Constitutional: She appears well-developed and well-nourished.  HENT:  Right Ear: External ear normal.  Left Ear: External ear normal.    Mouth/Throat: Oropharynx is clear and moist. No oropharyngeal exudate.  Eyes: Right eye exhibits discharge. Left eye exhibits discharge.  Neck: Neck supple.  Cardiovascular: Normal rate and regular rhythm.  Pulmonary/Chest: Effort normal and breath sounds normal. No respiratory distress. She has no rales.  Lymphadenopathy:    She has cervical adenopathy.  Skin: Skin is warm and dry.  Psychiatric: She has a normal mood and affect. Her behavior is normal.    BP 110/80 (BP Location: Left Arm, Patient Position: Sitting, Cuff Size: Normal)   Pulse 80   Temp 98.2 F (36.8 C) (Oral)   Resp 16   Wt 161 lb (73 kg)   SpO2 98%   BMI 27.64 kg/m  Wt Readings from Last 3 Encounters:  07/23/18 161 lb (73 kg)  06/10/18 157 lb (71.2 kg)  05/02/18 159 lb (72.1 kg)    Health Maintenance Due  Topic Date Due  . HIV Screening  04/06/2012  . TETANUS/TDAP  04/06/2016  . INFLUENZA VACCINE  03/27/2018    There are no preventive care reminders to display for this patient.   No results found for: TSH Lab Results  Component Value Date   WBC 5.9 10/03/2015   HGB 13.3 10/03/2015   HCT 39.4 10/03/2015   MCV 89.0 10/03/2015   PLT 270 10/03/2015   Lab Results  Component Value Date   NA 138 10/03/2015   K 3.5 10/03/2015   CO2 23 10/03/2015   GLUCOSE 81 10/03/2015   BUN 12 10/03/2015   CREATININE 0.69 10/03/2015   BILITOT 0.5 10/03/2015   ALKPHOS 32 (L) 10/03/2015   AST 18 10/03/2015   ALT 10 (L) 10/03/2015   PROT 7.0 10/03/2015   ALBUMIN 3.9 10/03/2015   CALCIUM 9.0 10/03/2015   ANIONGAP 9 10/03/2015   No results found for: CHOL No results found for: HDL No results found for: LDLCALC No results found for: TRIG No results found for: CHOLHDL No results found for: HGBA1C     Assessment & Plan:  1. Sore throat  - predniSONE (DELTASONE) 20 MG tablet; Take 1 tablet (20 mg total) by mouth daily with breakfast for 3 days.  Dispense: 3 tablet; Refill: 0  2. Laryngitis  Return if  symptoms worsen or fail to improve.  The entirety of the information documented in the History of Present Illness, Review of Systems and Physical Exam were personally obtained by me. Portions of this information were initially documented by Lynford Humphrey, CMA and reviewed by me for thoroughness and accuracy.   Fabio Bering  Terrilee Croak, PA-C

## 2018-08-06 ENCOUNTER — Other Ambulatory Visit: Payer: Self-pay | Admitting: Physician Assistant

## 2018-08-06 DIAGNOSIS — J45909 Unspecified asthma, uncomplicated: Secondary | ICD-10-CM

## 2018-10-07 ENCOUNTER — Ambulatory Visit (INDEPENDENT_AMBULATORY_CARE_PROVIDER_SITE_OTHER): Payer: BC Managed Care – PPO | Admitting: Physician Assistant

## 2018-10-07 ENCOUNTER — Other Ambulatory Visit: Payer: Self-pay | Admitting: Physician Assistant

## 2018-10-07 ENCOUNTER — Encounter: Payer: Self-pay | Admitting: Physician Assistant

## 2018-10-07 VITALS — BP 118/75 | HR 86 | Temp 98.2°F | Resp 16 | Wt 161.8 lb

## 2018-10-07 DIAGNOSIS — B3731 Acute candidiasis of vulva and vagina: Secondary | ICD-10-CM

## 2018-10-07 DIAGNOSIS — J45909 Unspecified asthma, uncomplicated: Secondary | ICD-10-CM

## 2018-10-07 DIAGNOSIS — R300 Dysuria: Secondary | ICD-10-CM | POA: Diagnosis not present

## 2018-10-07 DIAGNOSIS — B373 Candidiasis of vulva and vagina: Secondary | ICD-10-CM

## 2018-10-07 LAB — POCT URINALYSIS DIPSTICK
Bilirubin, UA: NEGATIVE
Blood, UA: NEGATIVE
Glucose, UA: NEGATIVE
Ketones, UA: NEGATIVE
Nitrite, UA: NEGATIVE
Protein, UA: NEGATIVE
Spec Grav, UA: 1.01 (ref 1.010–1.025)
Urobilinogen, UA: 0.2 E.U./dL
pH, UA: 8 (ref 5.0–8.0)

## 2018-10-07 MED ORDER — FLUCONAZOLE 150 MG PO TABS: ORAL_TABLET | ORAL | 0 refills | Status: DC

## 2018-10-07 MED ORDER — SULFAMETHOXAZOLE-TRIMETHOPRIM 800-160 MG PO TABS: 1.0000 | ORAL_TABLET | Freq: Two times a day (BID) | ORAL | 0 refills | Status: AC

## 2018-10-07 NOTE — Patient Instructions (Signed)

## 2018-10-07 NOTE — Progress Notes (Signed)
Patient: Shelley Walton Female    DOB: 07/22/1996   23 y.o.   MRN: [id number] Visit Date: 10/07/2018  Today's Provider: Trinna Post, PA-C   Chief Complaint  Patient presents with  . Urinary Tract Infection   Subjective:     HPI Urinary Tract Infection: Patient complains of burning with urination She has had symptoms for 1 day. Patient also complains of pain and burning. Patient denies fever. Patient does not have a history of recurrent UTI.  Patient does not have a history of pyelonephritis. Symptoms happened briefly over the weekend, resolved, then happened again today but have resolved for now.    No Known Allergies   Current Outpatient Medications:  .  ethynodiol-ethinyl estradiol (KELNOR 1/35) 1-35 MG-MCG tablet, Take 1 tablet by mouth daily., Disp: 3 Package, Rfl: 4 .  fluconazole (DIFLUCAN) 150 MG tablet, Take 1 tablet on day 1. Take 1 tablet three days later if symptoms still persist., Disp: 2 tablet, Rfl: 0 .  loratadine (CLARITIN) 10 MG tablet, Take 1 tablet (10 mg total) by mouth daily., Disp: 30 tablet, Rfl: 11 .  PROAIR HFA 108 (90 Base) MCG/ACT inhaler, , Disp: , Rfl:  .  sulfamethoxazole-trimethoprim (BACTRIM DS,SEPTRA DS) 800-160 MG tablet, Take 1 tablet by mouth 2 (two) times daily for 3 days., Disp: 6 tablet, Rfl: 0 .  valACYclovir (VALTREX) 1000 MG tablet, Take 1g once daily for five days at first sign of outbreak., Disp: 30 tablet, Rfl: 0 .  WIXELA INHUB 250-50 MCG/DOSE AEPB, INHALE 1 PUFF BY MOUTH TWICE DAILY, Disp: 60 each, Rfl: 1  Review of Systems  Constitutional: Negative.   Genitourinary: Positive for dysuria and frequency.    Social History   Tobacco Use  . Smoking status: Never Smoker  . Smokeless tobacco: Never Used  Substance Use Topics  . Alcohol use: No      Objective:   BP 118/75 (BP Location: Left Arm, Patient Position: Sitting, Cuff Size: Normal)   Pulse 86   Temp 98.2 F (36.8 C) (Oral)   Resp 16   Wt 161 lb 12.8 oz  (73.4 kg)   LMP 10/03/2018   BMI 27.77 kg/m  Vitals:   10/07/18 1209  BP: 118/75  Pulse: 86  Resp: 16  Temp: 98.2 F (36.8 C)  TempSrc: Oral  Weight: 161 lb 12.8 oz (73.4 kg)     Physical Exam Constitutional:      General: She is not in acute distress.    Appearance: She is well-developed. She is not diaphoretic.  Cardiovascular:     Rate and Rhythm: Normal rate and regular rhythm.  Pulmonary:     Effort: Pulmonary effort is normal.     Breath sounds: Normal breath sounds.  Abdominal:     General: Bowel sounds are normal. There is no distension.     Palpations: Abdomen is soft.     Tenderness: There is no abdominal tenderness. There is no guarding or rebound.  Skin:    General: Skin is warm and dry.  Neurological:     Mental Status: She is alert and oriented to person, place, and time.  Psychiatric:        Behavior: Behavior normal.         Assessment & Plan    1. Dysuria  UA with some leuks but no blood or nitrites. With intermittent symptoms not entirely certain this is UTI and I think she can delay abx until culture returns. Will  advise about culture. Abx given if sx worsen.   - sulfamethoxazole-trimethoprim (BACTRIM DS,SEPTRA DS) 800-160 MG tablet; Take 1 tablet by mouth 2 (two) times daily for 3 days.  Dispense: 6 tablet; Refill: 0 - POCT Urinalysis Dipstick - CULTURE, URINE COMPREHENSIVE  2. Vaginal candida  - fluconazole (DIFLUCAN) 150 MG tablet; Take 1 tablet on day 1. Take 1 tablet three days later if symptoms still persist.  Dispense: 2 tablet; Refill: 0  The entirety of the information documented in the History of Present Illness, Review of Systems and Physical Exam were personally obtained by me. Portions of this information were initially documented by Lynford Humphrey, CMA and reviewed by me for thoroughness and accuracy.   Return if symptoms worsen or fail to improve.     Trinna Post, PA-C  [facility]

## 2018-10-09 LAB — CULTURE, URINE COMPREHENSIVE

## 2018-10-10 ENCOUNTER — Telehealth: Payer: Self-pay

## 2018-10-10 NOTE — Telephone Encounter (Signed)
Patient advised as below.  

## 2018-10-10 NOTE — Telephone Encounter (Signed)
-----   Message from Trinna Post, [place] sent at 10/09/2018  5:12 PM EST ----- Urine culture did not come back with bacteria.

## 2018-11-07 ENCOUNTER — Telehealth: Payer: Self-pay | Admitting: Physician Assistant

## 2018-11-07 DIAGNOSIS — J45909 Unspecified asthma, uncomplicated: Secondary | ICD-10-CM

## 2018-11-07 MED ORDER — PROAIR HFA 108 (90 BASE) MCG/ACT IN AERS: 2.0000 | INHALATION_SPRAY | Freq: Four times a day (QID) | RESPIRATORY_TRACT | 1 refills | Status: DC | PRN

## 2018-11-07 NOTE — Telephone Encounter (Signed)
Refilled

## 2018-11-07 NOTE — Telephone Encounter (Signed)
Please Review

## 2018-11-07 NOTE — Telephone Encounter (Signed)
Pt needs a refill on albuterol inhaler.  She thinks she last got this at [facility]   She is not having symptoms but would like one to have on hand  [employer]  CB#  [phone]

## 2018-11-11 ENCOUNTER — Telehealth: Payer: Self-pay | Admitting: Physician Assistant

## 2018-11-11 DIAGNOSIS — J45909 Unspecified asthma, uncomplicated: Secondary | ICD-10-CM

## 2018-11-11 MED ORDER — ALBUTEROL SULFATE HFA 108 (90 BASE) MCG/ACT IN AERS: 2.0000 | INHALATION_SPRAY | Freq: Four times a day (QID) | RESPIRATORY_TRACT | 2 refills | Status: DC | PRN

## 2018-11-11 NOTE — Telephone Encounter (Signed)
pt's mom called saying the pharmacy would not fill because insurance would only pay for the generic inhaler.  Can we please resend with the generic inhaler  [facility]  Thanks  teri

## 2018-11-11 NOTE — Telephone Encounter (Signed)
Sent!

## 2018-12-04 ENCOUNTER — Other Ambulatory Visit: Payer: Self-pay | Admitting: Physician Assistant

## 2018-12-04 DIAGNOSIS — J45909 Unspecified asthma, uncomplicated: Secondary | ICD-10-CM

## 2019-01-30 ENCOUNTER — Other Ambulatory Visit: Payer: Self-pay | Admitting: Physician Assistant

## 2019-01-30 DIAGNOSIS — J45909 Unspecified asthma, uncomplicated: Secondary | ICD-10-CM

## 2019-01-30 NOTE — Telephone Encounter (Signed)
Please review

## 2019-02-11 ENCOUNTER — Ambulatory Visit (INDEPENDENT_AMBULATORY_CARE_PROVIDER_SITE_OTHER): Payer: BC Managed Care – PPO | Admitting: Obstetrics and Gynecology

## 2019-02-11 ENCOUNTER — Other Ambulatory Visit: Payer: Self-pay

## 2019-02-11 ENCOUNTER — Encounter: Payer: Self-pay | Admitting: Obstetrics and Gynecology

## 2019-02-11 VITALS — BP 100/60 | Ht 64.0 in | Wt 152.8 lb

## 2019-02-11 DIAGNOSIS — Z01419 Encounter for gynecological examination (general) (routine) without abnormal findings: Secondary | ICD-10-CM | POA: Diagnosis not present

## 2019-02-11 DIAGNOSIS — Z3041 Encounter for surveillance of contraceptive pills: Secondary | ICD-10-CM

## 2019-02-11 DIAGNOSIS — Z803 Family history of malignant neoplasm of breast: Secondary | ICD-10-CM

## 2019-02-11 DIAGNOSIS — Z113 Encounter for screening for infections with a predominantly sexual mode of transmission: Secondary | ICD-10-CM

## 2019-02-11 DIAGNOSIS — R350 Frequency of micturition: Secondary | ICD-10-CM | POA: Diagnosis not present

## 2019-02-11 LAB — POCT URINALYSIS DIPSTICK
Bilirubin, UA: NEGATIVE
Blood, UA: NEGATIVE
Glucose, UA: NEGATIVE
Ketones, UA: NEGATIVE
Leukocytes, UA: NEGATIVE
Nitrite, UA: NEGATIVE
Protein, UA: NEGATIVE
Spec Grav, UA: 1.01 (ref 1.010–1.025)
pH, UA: 7.5 (ref 5.0–8.0)

## 2019-02-11 MED ORDER — ETHYNODIOL DIAC-ETH ESTRADIOL 1-35 MG-MCG PO TABS: 1.0000 | ORAL_TABLET | Freq: Every day | ORAL | 3 refills | Status: DC

## 2019-02-11 NOTE — Patient Instructions (Signed)
I value your feedback and entrusting us with your care. If you get a [facility] patient survey, I would appreciate you taking the time to let us know about your experience today. Thank you! 

## 2019-02-11 NOTE — Progress Notes (Signed)
PCP:  Trinna Post, PA-C   Chief Complaint  Patient presents with  . Urinary Tract Infection    morning only burning sensation when urinates, frequency urinating, no blood in urine x since Friday  . Gynecologic Exam     HPI:      [name] is a 23 y.o. [id number] who LMP was Patient's last menstrual period was 01/28/2019 (approximate)., presents today for her NP annual examination.  Her menses are regular every 28-30 days, lasting 4 days.  Dysmenorrhea none. She does not have intermenstrual bleeding. On OCPs.  Sex activity: single partner, contraception - OCP (estrogen/progesterone).  Last Pap: May 02, 2018  Results were: no abnormalities  Hx of STDs: none; tested 9/19 but would like testing again today  There is a FH of breast cancer in her mom and distant mat relatives. Her mom is MyRisk neg. There is no FH of ovarian cancer. The patient does do self-breast exams. She is s/p RT breast fibroadenoma exc 2016. No other breast issues.  Tobacco use: The patient denies current or previous tobacco use. Alcohol use: none No drug use.  Exercise: moderately active  She does not get adequate calcium and Vitamin D in her diet.  Gardasil declined.  Labs with PCP.  Pt has noticed urinary frequency, urgency, and occas dysuria, intermittently for 6 days. No sx today but bad when it first started. No hematuria, LBP, pelvic pain, fevers. No vag sx. No hx of UTIs in past. No caffeine use.  Past Medical History:  Diagnosis Date  . Asthma    daily inhaler  . 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 Donne Hazel    Past Surgical History:  Procedure Laterality Date  . MASS EXCISION Right 10/14/2013   Procedure: RIGHT BREAST MASS EXCISION;  Surgeon: Rolm Bookbinder, MD;  Location: [facility];  Service: General;  Laterality: Right;    Family History  Problem Relation Age of Onset  . Breast cancer Mother 52       myRisk neg  . Multiple sclerosis  Mother   . Diabetes Maternal Grandfather   . Hypertension Maternal Grandfather   . Heart disease Maternal Grandfather   . Diabetes Maternal Grandmother   . Multiple sclerosis Maternal Uncle   . Healthy Brother   . Healthy Brother   . Breast cancer Other 74    Social History   Socioeconomic History  . Marital status: Single    Spouse name: Not on file  . Number of children: Not on file  . Years of education: Not on file  . Highest education level: Not on file  Occupational History  . Occupation: Ship broker    Comment: Planning to go to [employer] in fall for [employer].   . Occupation: works at [employer]  . Financial resource strain: Not on file  . Food insecurity    Worry: Not on file    Inability: Not on file  . Transportation needs    Medical: Not on file    Non-medical: Not on file  Tobacco Use  . Smoking status: Never Smoker  . Smokeless tobacco: Never Used  Substance and Sexual Activity  . Alcohol use: No  . Drug use: No  . Sexual activity: Yes    Birth control/protection: Pill  Lifestyle  . Physical activity    Days per week: Not on file    Minutes per session: Not on file  . Stress: Not on file  Relationships  . Social Herbalist on phone: Not on file    Gets together: Not on file    Attends religious service: Not on file    Active member of club or organization: Not on file    Attends meetings of clubs or organizations: Not on file    Relationship status: Not on file  . Intimate partner violence    Fear of current or ex partner: Not on file    Emotionally abused: Not on file    Physically abused: Not on file    Forced sexual activity: Not on file  Other Topics Concern  . Not on file  Social History Narrative  . Not on file    Outpatient Medications Prior to Visit  Medication Sig Dispense Refill  . albuterol (PROVENTIL HFA;VENTOLIN HFA) 108 (90 Base) MCG/ACT inhaler Inhale 2 puffs into the lungs every 6 (six) hours as needed  for wheezing or shortness of breath. 1 Inhaler 2  . loratadine (CLARITIN) 10 MG tablet Take 1 tablet (10 mg total) by mouth daily. 30 tablet 11  . valACYclovir (VALTREX) 1000 MG tablet Take 1g once daily for five days at first sign of outbreak. 30 tablet 0  . WIXELA INHUB 250-50 MCG/DOSE AEPB INHALE 1 PUFF BY MOUTH TWICE DAILY 60 each 1  . ethynodiol-ethinyl estradiol (KELNOR 1/35) 1-35 MG-MCG tablet Take 1 tablet by mouth daily. 3 Package 4  . fluconazole (DIFLUCAN) 150 MG tablet Take 1 tablet on day 1. Take 1 tablet three days later if symptoms still persist. 2 tablet 0   No facility-administered medications prior to visit.       ROS:  Review of Systems  Constitutional: Negative for fatigue, fever and unexpected weight change.  Respiratory: Negative for cough, shortness of breath and wheezing.   Cardiovascular: Negative for chest pain, palpitations and leg swelling.  Gastrointestinal: Negative for blood in stool, constipation, diarrhea, nausea and vomiting.  Endocrine: Negative for cold intolerance, heat intolerance and polyuria.  Genitourinary: Positive for frequency. Negative for dyspareunia, dysuria, flank pain, genital sores, hematuria, menstrual problem, pelvic pain, urgency, vaginal bleeding, vaginal discharge and vaginal pain.  Musculoskeletal: Negative for back pain, joint swelling and myalgias.  Skin: Negative for rash.  Neurological: Negative for dizziness, syncope, light-headedness, numbness and headaches.  Hematological: Negative for adenopathy.  Psychiatric/Behavioral: Negative for agitation, confusion, sleep disturbance and suicidal ideas. The patient is not nervous/anxious.   BREAST: No symptoms   Objective: BP 100/60   Ht 5\' 4"  (1.626 m)   Wt 152 lb 12.8 oz (69.3 kg)   LMP 01/28/2019 (Approximate)   BMI 26.23 kg/m    Physical Exam Constitutional:      Appearance: She is well-developed.  Genitourinary:     Vulva, vagina, cervix, uterus, right adnexa and left  adnexa normal.     No vulval lesion or tenderness noted.     No vaginal discharge, erythema or tenderness.     No cervical polyp.     Uterus is not enlarged or tender.     No right or left adnexal mass present.     Right adnexa not tender.     Left adnexa not tender.  Neck:     Musculoskeletal: Normal range of motion.     Thyroid: No thyromegaly.  Cardiovascular:     Rate and Rhythm: Normal rate and regular rhythm.     Heart sounds: Normal heart sounds. No murmur.  Pulmonary:     Effort: Pulmonary effort is  normal.     Breath sounds: Normal breath sounds.  Chest:     Breasts:        Right: No mass, nipple discharge, skin change or tenderness.        Left: No mass, nipple discharge, skin change or tenderness.  Abdominal:     Palpations: Abdomen is soft.     Tenderness: There is no abdominal tenderness. There is no guarding.  Musculoskeletal: Normal range of motion.  Neurological:     General: No focal deficit present.     Mental Status: She is alert and oriented to person, place, and time.     Cranial Nerves: No cranial nerve deficit.  Skin:    General: Skin is warm and dry.  Psychiatric:        Mood and Affect: Mood normal.        Behavior: Behavior normal.        Thought Content: Thought content normal.        Judgment: Judgment normal.  Vitals signs reviewed.     Results: Results for orders placed or performed in visit on 02/11/19 (from the past 24 hour(s))  POCT Urinalysis Dipstick     Status: Normal   Collection Time: 02/11/19 10:36 AM  Result Value Ref Range   Color, UA yellow    Clarity, UA clear    Glucose, UA Negative Negative   Bilirubin, UA neg    Ketones, UA neg    Spec Grav, UA 1.010 1.010 - 1.025   Blood, UA neg    pH, UA 7.5 5.0 - 8.0   Protein, UA Negative Negative   Urobilinogen, UA     Nitrite, UA neg    Leukocytes, UA Negative Negative   Appearance     Odor      Assessment/Plan: Encounter for annual routine gynecological examination -    Screening for STD (sexually transmitted disease) - Plan: Chlamydia trachomatis, DNA, amp probe, Gonococcus DNA, PCR,   Encounter for surveillance of contraceptive pills - Plan: ethynodiol-ethinyl estradiol Marliss Coots 1/35) 1-35 MG-MCG tablet, Rx RF. F/u prn.   Family history of breast cancer - Pt's mom is MyRisk neg. Mammos to start age 58.   Urinary frequency - Plan: POCT Urinalysis Dipstick, Urine Culture, Neg UA. Check C&S. Will f/u if pos. If neg, may be urethritis. F/u prn.  Meds ordered this encounter  Medications  . ethynodiol-ethinyl estradiol Marliss Coots 1/35) 1-35 MG-MCG tablet    Sig: Take 1 tablet by mouth daily.    Dispense:  3 Package    Refill:  3    Order Specific Question:   Supervising Provider    Answer:   Gae Dry [[id number]]             GYN counsel adequate intake of calcium and vitamin D, diet and exercise     F/U  Return in about 1 year (around 02/11/2020).  Arkie Tagliaferro B. Korban Shearer, PA-C 02/11/2019 10:37 AM

## 2019-02-13 LAB — URINE CULTURE

## 2019-02-13 MED ORDER — NITROFURANTOIN MONOHYD MACRO 100 MG PO CAPS: 100.0000 mg | ORAL_CAPSULE | Freq: Two times a day (BID) | ORAL | 0 refills | Status: AC

## 2019-02-13 NOTE — Progress Notes (Signed)
Pls let pt know C&S showed UTI. Rx macrobid eRxd. F/u prn.

## 2019-02-13 NOTE — Addendum Note (Signed)
Addended by: Ardeth Perfect B on: 01/30/7702 02:20 PM   Modules accepted: Orders

## 2019-02-16 NOTE — Progress Notes (Signed)
Pt aware.

## 2019-02-17 LAB — CHLAMYDIA TRACHOMATIS, DNA, AMP PROBE: Chlamydia trachomatis, NAA: NEGATIVE

## 2019-02-17 LAB — GONOCOCCUS DNA, PCR: Neisseria Gonorrhoeae by PCR: NEGATIVE

## 2019-03-31 ENCOUNTER — Other Ambulatory Visit: Payer: Self-pay | Admitting: Physician Assistant

## 2019-03-31 DIAGNOSIS — J45909 Unspecified asthma, uncomplicated: Secondary | ICD-10-CM

## 2019-04-22 ENCOUNTER — Other Ambulatory Visit: Payer: Self-pay | Admitting: Physician Assistant

## 2019-04-22 DIAGNOSIS — B373 Candidiasis of vulva and vagina: Secondary | ICD-10-CM

## 2019-04-22 DIAGNOSIS — B3731 Acute candidiasis of vulva and vagina: Secondary | ICD-10-CM

## 2019-04-30 ENCOUNTER — Ambulatory Visit: Payer: BC Managed Care – PPO | Admitting: Physician Assistant

## 2019-04-30 ENCOUNTER — Encounter: Payer: Self-pay | Admitting: Physician Assistant

## 2019-04-30 ENCOUNTER — Other Ambulatory Visit: Payer: Self-pay

## 2019-04-30 VITALS — BP 122/80 | HR 93 | Temp 96.9°F | Resp 16 | Wt 152.0 lb

## 2019-04-30 DIAGNOSIS — J45909 Unspecified asthma, uncomplicated: Secondary | ICD-10-CM

## 2019-04-30 DIAGNOSIS — J309 Allergic rhinitis, unspecified: Secondary | ICD-10-CM

## 2019-04-30 MED ORDER — MONTELUKAST SODIUM 10 MG PO TABS: 10.0000 mg | ORAL_TABLET | Freq: Every day | ORAL | 3 refills | Status: DC

## 2019-04-30 NOTE — Progress Notes (Signed)
Patient: Shelley Walton Female    DOB: 1996/05/04   23 y.o.   MRN: [id number] Visit Date: 04/30/2019  Today's Provider: Trinna Post, PA-C   Chief Complaint  Patient presents with  . Follow-up    asthma   Subjective:     HPI Patient here today to talk about her need to staying home from work due to her asthma. Patient denies any recent asthma attacks. 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 She is using wixela hub daily and also using albuterol PRN.   Patient also would like to get a referral for allergy testing. She has had allergies for many years and takes an Human resources officer. Previously on singulair but was of uncertain benefit so she stopped. Got a dog recently, thinking over allergy testing.   No Known Allergies   Current Outpatient Medications:  .  albuterol (PROVENTIL HFA;VENTOLIN HFA) 108 (90 Base) MCG/ACT inhaler, Inhale 2 puffs into the lungs every 6 (six) hours as needed for wheezing or shortness of breath., Disp: 1 Inhaler, Rfl: 2 .  ethynodiol-ethinyl estradiol (KELNOR 1/35) 1-35 MG-MCG tablet, Take 1 tablet by mouth daily., Disp: 3 Package, Rfl: 3 .  fexofenadine (ALLEGRA) 180 MG tablet, Take 180 mg by mouth daily., Disp: , Rfl:  .  valACYclovir (VALTREX) 1000 MG tablet, Take 1g once daily for five days at first sign of outbreak., Disp: 30 tablet, Rfl: 0 .  WIXELA INHUB 250-50 MCG/DOSE AEPB, INHALE 1 PUFF BY MOUTH TWICE DAILY, Disp: 60 each, Rfl: 1  Review of Systems  Constitutional: Negative.   HENT: Negative.   Eyes: Negative.   Respiratory: Negative.   Cardiovascular: Negative.     Social History   Tobacco Use  . Smoking status: Never Smoker  . Smokeless tobacco: Never Used  Substance Use Topics  . Alcohol use: No      Objective:   BP 122/80 (BP Location: Left Arm, Patient Position: Sitting, Cuff Size: Normal)   Pulse 93   Temp (!) 96.9 F (36.1 C) (Temporal)   Resp 16   Wt 152 lb (68.9  kg)   SpO2 97%   BMI 26.09 kg/m  Vitals:   04/30/19 1446  BP: 122/80  Pulse: 93  Resp: 16  Temp: (!) 96.9 F (36.1 C)  TempSrc: Temporal  SpO2: 97%  Weight: 152 lb (68.9 kg)  Body mass index is 26.09 kg/m.   Physical Exam Constitutional:      Appearance: Normal appearance.  Cardiovascular:     Rate and Rhythm: Normal rate and regular rhythm.     Heart sounds: Normal heart sounds.  Pulmonary:     Effort: Pulmonary effort is normal.     Breath sounds: Normal breath sounds.  Skin:    General: Skin is warm and dry.  Neurological:     Mental Status: She is alert and oriented to person, place, and time. Mental status is at baseline.  Psychiatric:        Mood and Affect: Mood normal.        Behavior: Behavior normal.      No results found for any visits on 04/30/19.     Assessment & Plan    1. Uncomplicated asthma, unspecified asthma severity, unspecified whether persistent  We will add back singulair to maximize allergy therapy. I have completed form for her work.   - montelukast (SINGULAIR) 10 MG tablet; Take 1 tablet (10 mg  total) by mouth at bedtime.  Dispense: 30 tablet; Refill: 3  2. Allergic rhinitis, unspecified seasonality, unspecified trigger  She would like to think about allergy testing and contact her insurance regarding coverage. I would be happy to refer her for testing and she knows she can message back if she would like to pursue this.   - montelukast (SINGULAIR) 10 MG tablet; Take 1 tablet (10 mg total) by mouth at bedtime.  Dispense: 30 tablet; Refill: 3  The entirety of the information documented in the History of Present Illness, Review of Systems and Physical Exam were personally obtained by me. Portions of this information were initially documented by Lynford Humphrey, CMA and reviewed by me for thoroughness and accuracy.   F/u PRN       Trinna Post, PA-C  [facility]

## 2019-04-30 NOTE — Patient Instructions (Signed)
Asthma, Adult ° °Asthma is a long-term (chronic) condition in which the airways get tight and narrow. The airways are the breathing passages that lead from the nose and mouth down into the lungs. A person with asthma will have times when symptoms get worse. These are called asthma attacks. They can cause coughing, whistling sounds when you breathe (wheezing), shortness of breath, and chest pain. They can make it hard to breathe. There is no cure for asthma, but medicines and lifestyle changes can help control it. °There are many things that can bring on an asthma attack or make asthma symptoms worse (triggers). Common triggers include: °· Mold. °· Dust. °· Cigarette smoke. °· Cockroaches. °· Things that can cause allergy symptoms (allergens). These include animal skin flakes (dander) and pollen from trees or grass. °· Things that pollute the air. These may include household cleaners, wood smoke, smog, or chemical odors. °· Cold air, weather changes, and wind. °· Crying or laughing hard. °· Stress. °· Certain medicines or drugs. °· Certain foods such as dried fruit, potato chips, and grape juice. °· Infections, such as a cold or the flu. °· Certain medical conditions or diseases. °· Exercise or tiring activities. °Asthma may be treated with medicines and by staying away from the things that cause asthma attacks. Types of medicines may include: °· Controller medicines. These help prevent asthma symptoms. They are usually taken every day. °· Fast-acting reliever or rescue medicines. These quickly relieve asthma symptoms. They are used as needed and provide short-term relief. °· Allergy medicines if your attacks are brought on by allergens. °· Medicines to help control the body's defense (immune) system. °Follow these instructions at home: °Avoiding triggers in your home °· Change your heating and air conditioning filter often. °· Limit your use of fireplaces and wood stoves. °· Get rid of pests (such as roaches and  mice) and their droppings. °· Throw away plants if you see mold on them. °· Clean your floors. Dust regularly. Use cleaning products that do not smell. °· Have someone vacuum when you are not home. Use a vacuum cleaner with a HEPA filter if possible. °· Replace carpet with wood, tile, or vinyl flooring. Carpet can trap animal skin flakes and dust. °· Use allergy-proof pillows, mattress covers, and box spring covers. °· Wash bed sheets and blankets every week in hot water. Dry them in a dryer. °· Keep your bedroom free of any triggers. °· Avoid pets and keep windows closed when things that cause allergy symptoms are in the air. °· Use blankets that are made of polyester or cotton. °· Clean bathrooms and kitchens with bleach. If possible, have someone repaint the walls in these rooms with mold-resistant paint. Keep out of the rooms that are being cleaned and painted. °· Wash your hands often with soap and water. If soap and water are not available, use hand sanitizer. °· Do not allow anyone to smoke in your home. °General instructions °· Take over-the-counter and prescription medicines only as told by your doctor. °? Talk with your doctor if you have questions about how or when to take your medicines. °? Make note if you need to use your medicines more often than usual. °· Do not use any products that contain nicotine or tobacco, such as cigarettes and e-cigarettes. If you need help quitting, ask your doctor. °· Stay away from secondhand smoke. °· Avoid doing things outdoors when allergen counts are high and when air quality is low. °· Wear a ski mask   when doing outdoor activities in the winter. The mask should cover your nose and mouth. Exercise indoors on cold days if you can. °· Warm up before you exercise. Take time to cool down after exercise. °· Use a peak flow meter as told by your doctor. A peak flow meter is a tool that measures how well the lungs are working. °· Keep track of the peak flow meter's readings.  Write them down. °· Follow your asthma action plan. This is a written plan for taking care of your asthma and treating your attacks. °· Make sure you get all the shots (vaccines) that your doctor recommends. Ask your doctor about a flu shot and a pneumonia shot. °· Keep all follow-up visits as told by your doctor. This is important. °Contact a doctor if: °· You have wheezing, shortness of breath, or a cough even while taking medicine to prevent attacks. °· The mucus you cough up (sputum) is thicker than usual. °· The mucus you cough up changes from clear or white to yellow, green, gray, or bloody. °· You have problems from the medicine you are taking, such as: °? A rash. °? Itching. °? Swelling. °? Trouble breathing. °· You need reliever medicines more than 2-3 times a week. °· Your peak flow reading is still at 50-79% of your personal best after following the action plan for 1 hour. °· You have a fever. °Get help right away if: °· You seem to be worse and are not responding to medicine during an asthma attack. °· You are short of breath even at rest. °· You get short of breath when doing very little activity. °· You have trouble eating, drinking, or talking. °· You have chest pain or tightness. °· You have a fast heartbeat. °· Your lips or fingernails start to turn blue. °· You are light-headed or dizzy, or you faint. °· Your peak flow is less than 50% of your personal best. °· You feel too tired to breathe normally. °Summary °· Asthma is a long-term (chronic) condition in which the airways get tight and narrow. An asthma attack can make it hard to breathe. °· Asthma cannot be cured, but medicines and lifestyle changes can help control it. °· Make sure you understand how to avoid triggers and how and when to use your medicines. °This information is not intended to replace advice given to you by your health care provider. Make sure you discuss any questions you have with your health care provider. °Document  Released: 01/30/2008 Document Revised: 10/16/2018 Document Reviewed: 09/17/2016 °Elsevier Patient Education © 2020 [employer] ° °

## 2019-05-12 ENCOUNTER — Ambulatory Visit: Payer: BC Managed Care – PPO | Admitting: Obstetrics and Gynecology

## 2019-05-13 ENCOUNTER — Ambulatory Visit (INDEPENDENT_AMBULATORY_CARE_PROVIDER_SITE_OTHER): Payer: BC Managed Care – PPO | Admitting: Obstetrics and Gynecology

## 2019-05-13 ENCOUNTER — Other Ambulatory Visit: Payer: Self-pay

## 2019-05-13 ENCOUNTER — Encounter: Payer: Self-pay | Admitting: Obstetrics and Gynecology

## 2019-05-13 VITALS — BP 100/70 | Ht 64.0 in | Wt 153.0 lb

## 2019-05-13 DIAGNOSIS — N6320 Unspecified lump in the left breast, unspecified quadrant: Secondary | ICD-10-CM

## 2019-05-13 NOTE — Progress Notes (Signed)
Trinna Post, PA-C   Chief Complaint  Patient presents with  . Breast exam    lump on left breast,bigger than a grape, painful x 3 weeks    HPI:      [name] is a 23 y.o. G0P0000 who LMP was Patient's last menstrual period was 04/15/2019 (approximate)., presents today for tender LT breast mass for the past 3 wks. 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 No erythema, nipple d/c, trauma to breast. She is s/p RT breast fibroadenoma exc 2016 with Dr. Donne Hazel.  There is a FH of breast cancer in her mom and distant mat relatives. Her mom is MyRisk neg.   Past Medical History:  Diagnosis Date  . Asthma    daily inhaler  . 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 Donne Hazel    Past Surgical History:  Procedure Laterality Date  . MASS EXCISION Right 10/14/2013   Procedure: RIGHT BREAST MASS EXCISION;  Surgeon: Rolm Bookbinder, MD;  Location: [facility];  Service: General;  Laterality: Right;    Family History  Problem Relation Age of Onset  . Breast cancer Mother 57       myRisk neg  . Multiple sclerosis Mother   . Diabetes Maternal Grandfather   . Hypertension Maternal Grandfather   . Heart disease Maternal Grandfather   . Diabetes Maternal Grandmother   . Multiple sclerosis Maternal Uncle   . Healthy Brother   . Healthy Brother   . Breast cancer Other 56    Social History   Socioeconomic History  . Marital status: Single    Spouse name: Not on file  . Number of children: Not on file  . Years of education: Not on file  . Highest education level: Not on file  Occupational History  . Occupation: Ship broker    Comment: Planning to go to [employer] in fall for [employer].   . Occupation: works at [employer]  . Financial resource strain: Not on file  . Food insecurity    Worry: Not on file    Inability: Not on file  . Transportation needs   Medical: Not on file    Non-medical: Not on file  Tobacco Use  . Smoking status: Never Smoker  . Smokeless tobacco: Never Used  Substance and Sexual Activity  . Alcohol use: No  . Drug use: No  . Sexual activity: Yes    Birth control/protection: Pill  Lifestyle  . Physical activity    Days per week: Not on file    Minutes per session: Not on file  . Stress: Not on file  Relationships  . Social Herbalist on phone: Not on file    Gets together: Not on file    Attends religious service: Not on file    Active member of club or organization: Not on file    Attends meetings of clubs or organizations: Not on file    Relationship status: Not on file  . Intimate partner violence    Fear of current or ex partner: Not on file    Emotionally abused: Not on file    Physically abused: Not on file    Forced sexual activity: Not on file  Other Topics Concern  . Not on file  Social History Narrative  . Not on file    Outpatient Medications  Prior to Visit  Medication Sig Dispense Refill  . albuterol (PROVENTIL HFA;VENTOLIN HFA) 108 (90 Base) MCG/ACT inhaler Inhale 2 puffs into the lungs every 6 (six) hours as needed for wheezing or shortness of breath. 1 Inhaler 2  . ethynodiol-ethinyl estradiol Marliss Coots 1/35) 1-35 MG-MCG tablet Take 1 tablet by mouth daily. 3 Package 3  . fexofenadine (ALLEGRA) 180 MG tablet Take 180 mg by mouth daily.    . montelukast (SINGULAIR) 10 MG tablet Take 1 tablet (10 mg total) by mouth at bedtime. 30 tablet 3  . valACYclovir (VALTREX) 1000 MG tablet Take 1g once daily for five days at first sign of outbreak. 30 tablet 0  . WIXELA INHUB 250-50 MCG/DOSE AEPB INHALE 1 PUFF BY MOUTH TWICE DAILY 60 each 1   No facility-administered medications prior to visit.       ROS:  Review of Systems  Constitutional: Negative for fatigue, fever and unexpected weight change.  Respiratory: Negative for cough, shortness of breath and wheezing.   Cardiovascular:  Negative for chest pain, palpitations and leg swelling.  Gastrointestinal: Negative for blood in stool, constipation, diarrhea, nausea and vomiting.  Endocrine: Negative for cold intolerance, heat intolerance and polyuria.  Genitourinary: Negative for dyspareunia, dysuria, flank pain, frequency, genital sores, hematuria, menstrual problem, pelvic pain, urgency, vaginal bleeding, vaginal discharge and vaginal pain.  Musculoskeletal: Negative for back pain, joint swelling and myalgias.  Skin: Negative for rash.  Neurological: Positive for headaches. Negative for dizziness, syncope, light-headedness and numbness.  Hematological: Negative for adenopathy.  Psychiatric/Behavioral: Negative for agitation, confusion, sleep disturbance and suicidal ideas. The patient is not nervous/anxious.    BREAST: mass/tenderness   OBJECTIVE:   Vitals:  BP 100/70   Ht 5\' 4"  (1.626 m)   Wt 153 lb (69.4 kg)   LMP 04/15/2019 (Approximate)   BMI 26.26 kg/m   Physical Exam Vitals signs reviewed.  Neck:     Musculoskeletal: Normal range of motion.  Pulmonary:     Effort: Pulmonary effort is normal.  Chest:     Breasts: Breasts are symmetrical.        Right: No inverted nipple, mass, nipple discharge, skin change or tenderness.        Left: No inverted nipple, mass, nipple discharge, skin change or tenderness.    Musculoskeletal: Normal range of motion.  Skin:    General: Skin is warm and dry.  Neurological:     General: No focal deficit present.     Mental Status: She is alert and oriented to person, place, and time.     Cranial Nerves: No cranial nerve deficit.  Psychiatric:        Mood and Affect: Mood normal.        Behavior: Behavior normal.        Thought Content: Thought content normal.        Judgment: Judgment normal.     Assessment/Plan: Left breast mass - Plan: US BREAST LTD UNI LEFT INC AXILLA; 10:30 LT breast mass, increasing in size per pt report. Rechk breast u/s. Will f/u with  results. May need to see Dr. Donne Hazel again.       Return if symptoms worsen or fail to improve.   B. , PA-C 05/13/2019 3:18 PM

## 2019-05-13 NOTE — Patient Instructions (Signed)
I value your feedback and entrusting us with your care. If you get a [facility] patient survey, I would appreciate you taking the time to let us know about your experience today. Thank you! 

## 2019-05-14 ENCOUNTER — Telehealth: Payer: Self-pay | Admitting: Obstetrics and Gynecology

## 2019-05-14 NOTE — Telephone Encounter (Signed)
Patient aware of appointment scheduled at [facility] on Monday September 21,2020 @ 9:20am

## 2019-05-18 ENCOUNTER — Ambulatory Visit
Admission: RE | Admit: 2019-05-18 | Discharge: 2019-05-18 | Disposition: A | Discharge status: Home / Self Care | Payer: BC Managed Care – PPO | Source: Ambulatory Visit | Attending: Obstetrics and Gynecology | Admitting: Obstetrics and Gynecology

## 2019-05-18 ENCOUNTER — Telehealth: Payer: Self-pay | Admitting: Obstetrics and Gynecology

## 2019-05-18 DIAGNOSIS — N6320 Unspecified lump in the left breast, unspecified quadrant: Secondary | ICD-10-CM | POA: Insufficient documentation

## 2019-05-18 NOTE — Telephone Encounter (Signed)
Pt aware of stable LT breast mass, c/w fibroadenoma. Given FH breast cancer in her mom, she would like to see Dr. Donne Hazel again for possible exc.

## 2019-05-25 ENCOUNTER — Telehealth: Payer: Self-pay | Admitting: Physician Assistant

## 2019-05-25 DIAGNOSIS — J45909 Unspecified asthma, uncomplicated: Secondary | ICD-10-CM

## 2019-05-25 MED ORDER — FLUTICASONE-SALMETEROL 250-50 MCG/DOSE IN AEPB: 1.0000 | INHALATION_SPRAY | Freq: Two times a day (BID) | RESPIRATORY_TRACT | 1 refills | Status: DC

## 2019-05-25 NOTE — Telephone Encounter (Signed)
[facility] faxed refill request for the following medications:  WIXELA INHUB 250-50 MCG/DOSE AEPB   LOV: 04/30/2019 Please advise. Thanks TNP

## 2019-05-27 ENCOUNTER — Other Ambulatory Visit: Payer: Self-pay

## 2019-05-27 DIAGNOSIS — Z20822 Contact with and (suspected) exposure to covid-19: Secondary | ICD-10-CM

## 2019-05-28 LAB — NOVEL CORONAVIRUS, NAA: SARS-CoV-2, NAA: NOT DETECTED

## 2019-06-08 ENCOUNTER — Other Ambulatory Visit: Payer: Self-pay | Admitting: Physician Assistant

## 2019-06-08 DIAGNOSIS — J45909 Unspecified asthma, uncomplicated: Secondary | ICD-10-CM

## 2019-06-08 MED ORDER — ALBUTEROL SULFATE HFA 108 (90 BASE) MCG/ACT IN AERS: 2.0000 | INHALATION_SPRAY | Freq: Four times a day (QID) | RESPIRATORY_TRACT | 3 refills | Status: DC | PRN

## 2019-06-08 NOTE — Telephone Encounter (Signed)
[facility] faxed refill request for the following medications:  albuterol (PROVENTIL HFA;VENTOLIN HFA) 108 (90 Base) MCG/ACT inhaler    Please advise.

## 2019-06-18 ENCOUNTER — Other Ambulatory Visit: Payer: Self-pay | Admitting: General Surgery

## 2019-06-18 DIAGNOSIS — N6320 Unspecified lump in the left breast, unspecified quadrant: Secondary | ICD-10-CM

## 2019-07-01 ENCOUNTER — Other Ambulatory Visit: Payer: Self-pay | Admitting: General Surgery

## 2019-07-01 DIAGNOSIS — N6320 Unspecified lump in the left breast, unspecified quadrant: Secondary | ICD-10-CM

## 2019-07-09 ENCOUNTER — Other Ambulatory Visit: Payer: Self-pay

## 2019-07-09 ENCOUNTER — Encounter (HOSPITAL_BASED_OUTPATIENT_CLINIC_OR_DEPARTMENT_OTHER): Payer: Self-pay | Admitting: *Deleted

## 2019-07-13 ENCOUNTER — Other Ambulatory Visit (HOSPITAL_COMMUNITY)
Admission: RE | Admit: 2019-07-13 | Discharge: 2019-07-13 | Disposition: A | Discharge status: Home / Self Care | Payer: BC Managed Care – PPO | Source: Ambulatory Visit | Attending: General Surgery | Admitting: General Surgery

## 2019-07-13 DIAGNOSIS — Z01812 Encounter for preprocedural laboratory examination: Secondary | ICD-10-CM | POA: Insufficient documentation

## 2019-07-13 DIAGNOSIS — Z20828 Contact with and (suspected) exposure to other viral communicable diseases: Secondary | ICD-10-CM | POA: Diagnosis not present

## 2019-07-13 LAB — SARS CORONAVIRUS 2 (TAT 6-24 HRS): SARS Coronavirus 2: NEGATIVE

## 2019-07-15 ENCOUNTER — Other Ambulatory Visit: Payer: Self-pay

## 2019-07-15 ENCOUNTER — Other Ambulatory Visit: Payer: Self-pay | Admitting: General Surgery

## 2019-07-15 ENCOUNTER — Ambulatory Visit
Admission: RE | Admit: 2019-07-15 | Discharge: 2019-07-15 | Disposition: A | Discharge status: Home / Self Care | Payer: BC Managed Care – PPO | Source: Ambulatory Visit | Attending: General Surgery | Admitting: General Surgery

## 2019-07-15 DIAGNOSIS — N6320 Unspecified lump in the left breast, unspecified quadrant: Secondary | ICD-10-CM

## 2019-07-16 ENCOUNTER — Other Ambulatory Visit (HOSPITAL_COMMUNITY): Payer: BC Managed Care – PPO

## 2019-07-16 ENCOUNTER — Ambulatory Visit (HOSPITAL_BASED_OUTPATIENT_CLINIC_OR_DEPARTMENT_OTHER)
Admission: RE | Admit: 2019-07-16 | Discharge: 2019-07-16 | Disposition: A | Discharge status: Home / Self Care | Payer: BC Managed Care – PPO | Attending: General Surgery | Admitting: General Surgery

## 2019-07-16 ENCOUNTER — Ambulatory Visit (HOSPITAL_BASED_OUTPATIENT_CLINIC_OR_DEPARTMENT_OTHER): Payer: BC Managed Care – PPO | Admitting: Certified Registered"

## 2019-07-16 ENCOUNTER — Encounter (HOSPITAL_BASED_OUTPATIENT_CLINIC_OR_DEPARTMENT_OTHER): Payer: Self-pay

## 2019-07-16 ENCOUNTER — Encounter (HOSPITAL_BASED_OUTPATIENT_CLINIC_OR_DEPARTMENT_OTHER)
Admission: RE | Disposition: A | Discharge status: Home / Self Care | Payer: Self-pay | Source: Home / Self Care | Attending: General Surgery

## 2019-07-16 ENCOUNTER — Other Ambulatory Visit: Payer: Self-pay

## 2019-07-16 ENCOUNTER — Ambulatory Visit
Admission: RE | Admit: 2019-07-16 | Discharge: 2019-07-16 | Disposition: A | Discharge status: Home / Self Care | Payer: BC Managed Care – PPO | Source: Ambulatory Visit | Attending: General Surgery | Admitting: General Surgery

## 2019-07-16 DIAGNOSIS — N6322 Unspecified lump in the left breast, upper inner quadrant: Secondary | ICD-10-CM | POA: Insufficient documentation

## 2019-07-16 DIAGNOSIS — J45909 Unspecified asthma, uncomplicated: Secondary | ICD-10-CM | POA: Diagnosis not present

## 2019-07-16 DIAGNOSIS — Z803 Family history of malignant neoplasm of breast: Secondary | ICD-10-CM | POA: Diagnosis not present

## 2019-07-16 DIAGNOSIS — Z79899 Other long term (current) drug therapy: Secondary | ICD-10-CM | POA: Insufficient documentation

## 2019-07-16 DIAGNOSIS — N6320 Unspecified lump in the left breast, unspecified quadrant: Secondary | ICD-10-CM

## 2019-07-16 HISTORY — PX: RADIOACTIVE SEED GUIDED EXCISIONAL BREAST BIOPSY: SHX6490

## 2019-07-16 HISTORY — DX: Nausea with vomiting, unspecified: R11.2

## 2019-07-16 HISTORY — DX: Other specified postprocedural states: Z98.890

## 2019-07-16 LAB — POCT PREGNANCY, URINE: Preg Test, Ur: NEGATIVE

## 2019-07-16 SURGERY — RADIOACTIVE SEED GUIDED BREAST BIOPSY
Anesthesia: General | Site: Breast | Laterality: Left

## 2019-07-16 MED ORDER — FENTANYL CITRATE (PF) 100 MCG/2ML IJ SOLN
INTRAMUSCULAR | Status: DC | PRN
  Administered 2019-07-16 (×3): 25 ug via INTRAVENOUS

## 2019-07-16 MED ORDER — ACETAMINOPHEN 500 MG PO TABS
1000.0000 mg | ORAL_TABLET | ORAL | Status: AC
  Administered 2019-07-16: 1000 mg via ORAL

## 2019-07-16 MED ORDER — CEFAZOLIN SODIUM-DEXTROSE 2-4 GM/100ML-% IV SOLN
INTRAVENOUS | Status: AC
  Filled 2019-07-16: qty 100

## 2019-07-16 MED ORDER — LACTATED RINGERS IV SOLN
INTRAVENOUS | Status: DC
  Administered 2019-07-16 (×2): via INTRAVENOUS

## 2019-07-16 MED ORDER — BUPIVACAINE HCL (PF) 0.25 % IJ SOLN
INTRAMUSCULAR | Status: DC | PRN
  Administered 2019-07-16: 10 mL

## 2019-07-16 MED ORDER — PROPOFOL 10 MG/ML IV BOLUS
INTRAVENOUS | Status: AC
  Filled 2019-07-16: qty 40

## 2019-07-16 MED ORDER — LIDOCAINE 2% (20 MG/ML) 5 ML SYRINGE
INTRAMUSCULAR | Status: AC
  Filled 2019-07-16: qty 5

## 2019-07-16 MED ORDER — FENTANYL CITRATE (PF) 100 MCG/2ML IJ SOLN
INTRAMUSCULAR | Status: AC
  Filled 2019-07-16: qty 2

## 2019-07-16 MED ORDER — SCOPOLAMINE 1 MG/3DAYS TD PT72
MEDICATED_PATCH | TRANSDERMAL | Status: AC
  Filled 2019-07-16: qty 1

## 2019-07-16 MED ORDER — GABAPENTIN 100 MG PO CAPS
ORAL_CAPSULE | ORAL | Status: AC
  Filled 2019-07-16: qty 1

## 2019-07-16 MED ORDER — GABAPENTIN 100 MG PO CAPS
100.0000 mg | ORAL_CAPSULE | ORAL | Status: AC
  Administered 2019-07-16: 100 mg via ORAL

## 2019-07-16 MED ORDER — PROPOFOL 10 MG/ML IV BOLUS
INTRAVENOUS | Status: DC | PRN
  Administered 2019-07-16: 200 mg via INTRAVENOUS

## 2019-07-16 MED ORDER — FENTANYL CITRATE (PF) 100 MCG/2ML IJ SOLN
50.0000 ug | Freq: Once | INTRAMUSCULAR | Status: AC
  Administered 2019-07-16: 50 ug via INTRAVENOUS

## 2019-07-16 MED ORDER — KETOROLAC TROMETHAMINE 15 MG/ML IJ SOLN
15.0000 mg | INTRAMUSCULAR | Status: AC
  Administered 2019-07-16: 15 mg via INTRAVENOUS

## 2019-07-16 MED ORDER — FENTANYL CITRATE (PF) 100 MCG/2ML IJ SOLN: 25.0000 ug | INTRAMUSCULAR | Status: DC | PRN

## 2019-07-16 MED ORDER — CEFAZOLIN SODIUM-DEXTROSE 2-4 GM/100ML-% IV SOLN
2.0000 g | INTRAVENOUS | Status: AC
  Administered 2019-07-16: 2 g via INTRAVENOUS

## 2019-07-16 MED ORDER — PROPOFOL 500 MG/50ML IV EMUL
INTRAVENOUS | Status: DC | PRN
  Administered 2019-07-16: 25 ug/kg/min via INTRAVENOUS

## 2019-07-16 MED ORDER — MIDAZOLAM HCL 5 MG/5ML IJ SOLN
INTRAMUSCULAR | Status: DC | PRN
  Administered 2019-07-16: 2 mg via INTRAVENOUS

## 2019-07-16 MED ORDER — KETOROLAC TROMETHAMINE 15 MG/ML IJ SOLN
INTRAMUSCULAR | Status: AC
  Filled 2019-07-16: qty 1

## 2019-07-16 MED ORDER — ENSURE PRE-SURGERY PO LIQD: 296.0000 mL | Freq: Once | ORAL | Status: DC

## 2019-07-16 MED ORDER — SCOPOLAMINE 1 MG/3DAYS TD PT72
1.0000 | MEDICATED_PATCH | TRANSDERMAL | Status: DC
  Administered 2019-07-16: 1.5 mg via TRANSDERMAL

## 2019-07-16 MED ORDER — ACETAMINOPHEN 500 MG PO TABS
ORAL_TABLET | ORAL | Status: AC
  Filled 2019-07-16: qty 2

## 2019-07-16 MED ORDER — LIDOCAINE 2% (20 MG/ML) 5 ML SYRINGE
INTRAMUSCULAR | Status: DC | PRN
  Administered 2019-07-16: 60 mg via INTRAVENOUS

## 2019-07-16 MED ORDER — TRAMADOL HCL 50 MG PO TABS: 100.0000 mg | ORAL_TABLET | Freq: Four times a day (QID) | ORAL | 0 refills | Status: DC | PRN

## 2019-07-16 MED ORDER — DEXAMETHASONE SODIUM PHOSPHATE 10 MG/ML IJ SOLN
INTRAMUSCULAR | Status: DC | PRN
  Administered 2019-07-16: 5 mg via INTRAVENOUS

## 2019-07-16 MED ORDER — MIDAZOLAM HCL 2 MG/2ML IJ SOLN
INTRAMUSCULAR | Status: AC
  Filled 2019-07-16: qty 2

## 2019-07-16 SURGICAL SUPPLY — 54 items
APPLIER CLIP 9.375 MED OPEN (MISCELLANEOUS)
BINDER BREAST LRG (GAUZE/BANDAGES/DRESSINGS) ×3 IMPLANT
BINDER BREAST MEDIUM (GAUZE/BANDAGES/DRESSINGS) IMPLANT
BLADE SURG 15 STRL LF DISP TIS (BLADE) ×1 IMPLANT
BLADE SURG 15 STRL SS (BLADE) ×2
CANISTER SUC SOCK COL 7IN (MISCELLANEOUS) IMPLANT
CANISTER SUCT 1200ML W/VALVE (MISCELLANEOUS) IMPLANT
CHLORAPREP W/TINT 26 (MISCELLANEOUS) ×3 IMPLANT
CLIP APPLIE 9.375 MED OPEN (MISCELLANEOUS) IMPLANT
CLOSURE WOUND 1/2 X4 (GAUZE/BANDAGES/DRESSINGS) ×1
COVER BACK TABLE REUSABLE LG (DRAPES) ×3 IMPLANT
COVER MAYO STAND REUSABLE (DRAPES) ×3 IMPLANT
COVER PROBE W GEL 5X96 (DRAPES) ×3 IMPLANT
DECANTER SPIKE VIAL GLASS SM (MISCELLANEOUS) IMPLANT
DERMABOND ADVANCED (GAUZE/BANDAGES/DRESSINGS) ×2
DERMABOND ADVANCED .7 DNX12 (GAUZE/BANDAGES/DRESSINGS) ×1 IMPLANT
DRAPE LAPAROSCOPIC ABDOMINAL (DRAPES) ×3 IMPLANT
DRAPE UTILITY XL STRL (DRAPES) ×3 IMPLANT
ELECT COATED BLADE 2.86 ST (ELECTRODE) ×3 IMPLANT
ELECT REM PT RETURN 9FT ADLT (ELECTROSURGICAL) ×3
ELECTRODE REM PT RTRN 9FT ADLT (ELECTROSURGICAL) ×1 IMPLANT
GAUZE SPONGE 4X4 12PLY STRL LF (GAUZE/BANDAGES/DRESSINGS) IMPLANT
GLOVE BIO SURGEON STRL SZ7 (GLOVE) ×6 IMPLANT
GLOVE BIOGEL PI IND STRL 6.5 (GLOVE) ×1 IMPLANT
GLOVE BIOGEL PI IND STRL 7.5 (GLOVE) ×1 IMPLANT
GLOVE BIOGEL PI INDICATOR 6.5 (GLOVE) ×2
GLOVE BIOGEL PI INDICATOR 7.5 (GLOVE) ×2
GLOVE ECLIPSE 6.5 STRL STRAW (GLOVE) ×3 IMPLANT
GOWN STRL REUS W/ TWL LRG LVL3 (GOWN DISPOSABLE) ×2 IMPLANT
GOWN STRL REUS W/TWL LRG LVL3 (GOWN DISPOSABLE) ×4
HEMOSTAT ARISTA ABSORB 3G PWDR (HEMOSTASIS) IMPLANT
ILLUMINATOR WAVEGUIDE N/F (MISCELLANEOUS) ×3 IMPLANT
KIT MARKER MARGIN INK (KITS) ×3 IMPLANT
LIGHT WAVEGUIDE WIDE FLAT (MISCELLANEOUS) IMPLANT
NEEDLE HYPO 25X1 1.5 SAFETY (NEEDLE) ×3 IMPLANT
NS IRRIG 1000ML POUR BTL (IV SOLUTION) IMPLANT
PACK BASIN DAY SURGERY FS (CUSTOM PROCEDURE TRAY) ×3 IMPLANT
PENCIL SMOKE EVACUATOR (MISCELLANEOUS) ×3 IMPLANT
SLEEVE SCD COMPRESS KNEE MED (MISCELLANEOUS) ×3 IMPLANT
SPONGE LAP 4X18 RFD (DISPOSABLE) ×3 IMPLANT
STRIP CLOSURE SKIN 1/2X4 (GAUZE/BANDAGES/DRESSINGS) ×2 IMPLANT
SUT MNCRL AB 4-0 PS2 18 (SUTURE) IMPLANT
SUT MON AB 5-0 PS2 18 (SUTURE) IMPLANT
SUT SILK 2 0 SH (SUTURE) IMPLANT
SUT VIC AB 2-0 SH 27 (SUTURE) ×2
SUT VIC AB 2-0 SH 27XBRD (SUTURE) ×1 IMPLANT
SUT VIC AB 3-0 SH 27 (SUTURE) ×2
SUT VIC AB 3-0 SH 27X BRD (SUTURE) ×1 IMPLANT
SYR CONTROL 10ML LL (SYRINGE) ×3 IMPLANT
TOWEL GREEN STERILE FF (TOWEL DISPOSABLE) ×3 IMPLANT
TRAY FAXITRON CT DISP (TRAY / TRAY PROCEDURE) ×3 IMPLANT
TUBE CONNECTING 20'X1/4 (TUBING)
TUBE CONNECTING 20X1/4 (TUBING) IMPLANT
YANKAUER SUCT BULB TIP NO VENT (SUCTIONS) IMPLANT

## 2019-07-16 NOTE — Transfer of Care (Signed)
Immediate Anesthesia Transfer of Care Note  Patient: Shelley Walton  Procedure(s) Performed: RADIOACTIVE SEED GUIDED EXCISIONAL LEFT BREAST BIOPSY (Left Breast)  Patient Location: PACU  Anesthesia Type:General  Level of Consciousness: drowsy  Airway & Oxygen Therapy: Patient Spontanous Breathing and Patient connected to face mask oxygen  Post-op Assessment: Report given to RN and Post -op Vital signs reviewed and stable  Post vital signs: Reviewed and stable  Last Vitals:  Vitals Value Taken Time  BP 113/57 07/16/19 1500  Temp    Pulse 100 07/16/19 1504  Resp 16 07/16/19 1504  SpO2 100 % 07/16/19 1504  Vitals shown include unvalidated device data.  Last Pain:  Vitals:   07/16/19 1156  TempSrc: Oral  PainSc: 0-No pain         Complications: No apparent anesthesia complications

## 2019-07-16 NOTE — Interval H&P Note (Signed)
History and Physical Interval Note:  07/16/2019 1:37 PM  Shelley Walton  has presented today for surgery, with the diagnosis of left breast mass.  The various methods of treatment have been discussed with the patient and family. After consideration of risks, benefits and other options for treatment, the patient has consented to  Procedure(s): RADIOACTIVE SEED GUIDED EXCISIONAL LEFT BREAST BIOPSY (Left) as a surgical intervention.  The patient's history has been reviewed, patient examined, no change in status, stable for surgery.  I have reviewed the patient's chart and labs.  Questions were answered to the patient's satisfaction.     Rolm Bookbinder

## 2019-07-16 NOTE — Discharge Instructions (Signed)
[facility] °Office Phone Number [phone] ° °POST OP INSTRUCTIONS °Take 400 mg of ibuprofen every 8 hours or 650 mg tylenol every 6 hours for next 72 hours then as needed. Use ice several times daily also. °Always review your discharge instruction sheet given to you by the facility where your surgery was performed. ° °IF YOU HAVE DISABILITY OR FAMILY LEAVE FORMS, YOU MUST BRING THEM TO THE OFFICE FOR PROCESSING.  DO NOT GIVE THEM TO YOUR DOCTOR. ° °1. A prescription for pain medication may be given to you upon discharge.  Take your pain medication as prescribed, if needed.  If narcotic pain medicine is not needed, then you may take acetaminophen (Tylenol), naprosyn (Alleve) or ibuprofen (Advil) as needed. °2. Take your usually prescribed medications unless otherwise directed °3. If you need a refill on your pain medication, please contact your pharmacy.  They will contact our office to request authorization.  Prescriptions will not be filled after 5pm or on week-ends. °4. You should eat very light the first 24 hours after surgery, such as soup, crackers, pudding, etc.  Resume your normal diet the day after surgery. °5. Most patients will experience some swelling and bruising in the breast.  Ice packs and a good support bra will help.  Wear the breast binder provided or a sports bra for 72 hours day and night.  After that wear a sports bra during the day until you return to the office. Swelling and bruising can take several days to resolve.  °6. It is common to experience some constipation if taking pain medication after surgery.  Increasing fluid intake and taking a stool softener will usually help or prevent this problem from occurring.  A mild laxative (Milk of Magnesia or Miralax) should be taken according to package directions if there are no bowel movements after 48 hours. °7. Unless discharge instructions indicate otherwise, you may remove your bandages 48 hours after surgery and you may  shower at that time.  You may have steri-strips (small skin tapes) in place directly over the incision.  These strips should be left on the skin for 7-10 days and will come off on their own.  If your surgeon used skin glue on the incision, you may shower in 24 hours.  The glue will flake off over the next 2-3 weeks.  Any sutures or staples will be removed at the office during your follow-up visit. °8. ACTIVITIES:  You may resume regular daily activities (gradually increasing) beginning the next day.  Wearing a good support bra or sports bra minimizes pain and swelling.  You may have sexual intercourse when it is comfortable. °a. You may drive when you no longer are taking prescription pain medication, you can comfortably wear a seatbelt, and you can safely maneuver your car and apply brakes. °b. RETURN TO WORK:  ______________________________________________________________________________________ °9. You should see your doctor in the office for a follow-up appointment approximately two weeks after your surgery.  Your doctor’s nurse will typically make your follow-up appointment when she calls you with your pathology report.  Expect your pathology report 3-4 business days after your surgery.  You may call to check if you do not hear from us after three days. °10. OTHER INSTRUCTIONS: _______________________________________________________________________________________________ _____________________________________________________________________________________________________________________________________ °_____________________________________________________________________________________________________________________________________ °_____________________________________________________________________________________________________________________________________ ° °WHEN TO CALL DR WAKEFIELD: °1. Fever over 101.0 °2. Nausea and/or vomiting. °3. Extreme swelling or bruising. °4. Continued bleeding from  incision. °5. Increased pain, redness, or drainage from the incision. ° °The clinic staff is available to   answer your questions during regular business hours.  Please don’t hesitate to call and ask to speak to one of the nurses for clinical concerns.  If you have a medical emergency, go to the nearest emergency room or call 911.  A surgeon from [facility] is always on call at the hospital. ° °For further questions, please visit [email or web address] ° ° ° ° °Post Anesthesia Home Care Instructions ° °Activity: °Get plenty of rest for the remainder of the day. A responsible individual must stay with you for 24 hours following the procedure.  °For the next 24 hours, DO NOT: °-Drive a car °-Operate machinery °-Drink alcoholic beverages °-Take any medication unless instructed by your physician °-Make any legal decisions or sign important papers. ° °Meals: °Start with liquid foods such as gelatin or soup. Progress to regular foods as tolerated. Avoid greasy, spicy, heavy foods. If nausea and/or vomiting occur, drink only clear liquids until the nausea and/or vomiting subsides. Call your physician if vomiting continues. ° °Special Instructions/Symptoms: °Your throat may feel dry or sore from the anesthesia or the breathing tube placed in your throat during surgery. If this causes discomfort, gargle with warm salt water. The discomfort should disappear within 24 hours. ° °If you had a scopolamine patch placed behind your ear for the management of post- operative nausea and/or vomiting: ° °1. The medication in the patch is effective for 72 hours, after which it should be removed.  Wrap patch in a tissue and discard in the trash. Wash hands thoroughly with soap and water. °2. You may remove the patch earlier than 72 hours if you experience unpleasant side effects which may include dry mouth, dizziness or visual disturbances. °3. Avoid touching the patch. Wash your hands with soap and water after contact  with the patch. °   ° °

## 2019-07-16 NOTE — Anesthesia Procedure Notes (Signed)
Procedure Name: LMA Insertion Date/Time: 07/16/2019 2:07 PM Performed by: Lavonia Dana, CRNA Pre-anesthesia Checklist: Patient identified, Emergency Drugs available, Suction available and Patient being monitored Patient Re-evaluated:Patient Re-evaluated prior to induction Oxygen Delivery Method: Circle system utilized Preoxygenation: Pre-oxygenation with 100% oxygen Induction Type: IV induction Ventilation: Mask ventilation without difficulty LMA: LMA inserted LMA Size: 4.0 Number of attempts: 1 Airway Equipment and Method: Bite block Placement Confirmation: positive ETCO2 Tube secured with: Tape Dental Injury: Teeth and Oropharynx as per pre-operative assessment

## 2019-07-16 NOTE — H&P (Signed)
14 yof who I have removed pash in right breast before also has a left breast mass. we have been following this as it appeared to be fa. 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 her mom is a breast cancer patient of mine also. the mass on left has become tender and she thinks larger to her. latest US shows a 1.4x0x8x1.8 cm mass left breast 1030 6 cm from nipple that is unchanged. she has no discharge. she would like to consider excision   Past Surgical History Geni Bers [place], RMA; 06/18/2019 3:38 PM) Breast Mass; Local Excision  Right.  Diagnostic Studies History Geni Bers [place], RMA; 06/18/2019 3:38 PM) Colonoscopy  never Mammogram  never Pap Smear  1-5 years ago never  Allergies Geni Bers [place], RMA; 06/18/2019 3:38 PM) No Known Drug Allergies [04/25/2015]: Allergies Reconciled   Medication History [employer], RMA; 06/18/2019 3:39 PM) Absorica (40MG  Capsule, Oral) Active. Advair Diskus (100-50MCG/DOSE Aero Pow Br Act, Inhalation) Active. Reclipsen (0.15-30MG -MCG Tablet, Oral) Active. Ethynodiol Diac-Eth Estradiol (1-35MG -MCG Tablet, Oral) Active. Montelukast Sodium (10MG  Tablet, Oral) Active. Albuterol Sulfate HFA (108 (90 Base)MCG/ACT Aerosol Soln, Inhalation) Active. Medications Reconciled  Social History Geni Bers [place], RMA; 06/18/2019 3:38 PM) Alcohol use  Occasional alcohol use. Caffeine use  Carbonated beverages, Coffee, Tea. No drug use  Tobacco use  Never smoker.  Family History Geni Bers [place], [place]; 06/18/2019 3:38 PM) Breast Cancer  Mother. Hypertension  Father. Prostate Cancer  Father. Respiratory Condition  Brother.  Pregnancy / Birth History Geni Bers [place], RMA; 06/18/2019 3:38 PM) Age at menarche  68 years, 48 years. Contraceptive History  Oral contraceptives. Gravida  0 Para  0 Regular periods   Other Problems Geni Bers [place], RMA; 06/18/2019 3:38 PM) Asthma   Lump In Breast     Review of Systems Geni Bers [place] RMA; 06/18/2019 3:38 PM) General Not Present- Appetite Loss, Chills, Fatigue, Fever, Night Sweats, Weight Gain and Weight Loss. Skin Not Present- Change in Wart/Mole, Dryness, Hives, Jaundice, New Lesions, Non-Healing Wounds, Rash and Ulcer. HEENT Not Present- Earache, Hearing Loss, Hoarseness, Nose Bleed, Oral Ulcers, Ringing in the Ears, Seasonal Allergies, Sinus Pain, Sore Throat, Visual Disturbances, Wears glasses/contact lenses and Yellow Eyes. Respiratory Not Present- Bloody sputum, Chronic Cough, Difficulty Breathing, Snoring and Wheezing. Breast Present- Breast Mass and Breast Pain. Not Present- Nipple Discharge and Skin Changes. Cardiovascular Not Present- Chest Pain, Difficulty Breathing Lying Down, Leg Cramps, Palpitations, Rapid Heart Rate, Shortness of Breath and Swelling of Extremities. Gastrointestinal Not Present- Abdominal Pain, Bloating, Bloody Stool, Change in Bowel Habits, Chronic diarrhea, Constipation, Difficulty Swallowing, Excessive gas, Gets full quickly at meals, Hemorrhoids, Indigestion, Nausea, Rectal Pain and Vomiting. Female Genitourinary Not Present- Frequency, Nocturia, Painful Urination, Pelvic Pain and Urgency. Musculoskeletal Not Present- Back Pain, Joint Pain, Joint Stiffness, Muscle Pain, Muscle Weakness and Swelling of Extremities. Neurological Not Present- Decreased Memory, Fainting, Headaches, Numbness, Seizures, Tingling, Tremor, Trouble walking and Weakness. Psychiatric Not Present- Anxiety, Bipolar, Change in Sleep Pattern, Depression, Fearful and Frequent crying. Endocrine Not Present- Cold Intolerance, Excessive Hunger, Hair Changes, Heat Intolerance, Hot flashes and New Diabetes. Hematology Not Present- Blood Thinners, Easy Bruising, Excessive bleeding, Gland problems, HIV and Persistent Infections.  Vitals Geni Bers [place] RMA; 06/18/2019 3:39 PM) 06/18/2019 3:39 PM Weight: 154.8 lb  Height: 64in Body Surface Area: 1.75 m Body Mass Index: 26.57 kg/m  Temp.: 97.29F(Temporal)  Pulse: 81 (Regular)  P.OX: 97% (Room air) BP: 122/80 (Sitting, Right Arm, Standard)   Physical Exam Rolm Bookbinder MD; 06/18/2019 3:57 PM) General Mental  Status-Alert. Orientation-Oriented X3. Breast Nipples-No Discharge. Breast - Left-Normal. Breast - Right-Normal. Breast - Bilateral-Non Tender. Note: left upper inner breast mass vague about 1.5 cm Lymphatic Head & Neck General Head & Neck Lymphatics: Bilateral - Description - Normal. Axillary General Axillary Region: Bilateral - Description - Normal. Note: no [place] adenopathy   Assessment & Plan Rolm Bookbinder MD; 06/18/2019 3:58 PM) LEFT BREAST MASS (N63.20) Story: Left breast mass seed guided excision she would like to have this excised. she has some concern due to fh and now has symptoms. I think this is reasonable. we discussed seed guided excision, risks and recovery

## 2019-07-16 NOTE — Op Note (Signed)
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 Serita Grammes Anesthesia: General Estimated blood loss: Minimal Complications: None Drains: None Specimens: Left breast mass marked with pain Disposition to recovery stable condition Sponge and needle count was correct at completion  Indications:This is a 23 year old female who has a left breast mass.  Ultrasound this is 1.4 cm.  Due to her family history and some tenderness at this site she would like to consider excision.  It is difficult to palpate and I discussed a seed guided excision with her.  Procedure: After informed consent was obtained the patient was taken to the operating room.  She had received antibiotics.  SCDs were in place.  She was placed under general anesthesia without complication.  She was prepped and draped in a standard sterile surgical fashion.  A surgical timeout was then performed.  I identified the seed in the left upper inner quadrant.  I then infiltrated Marcaine.  I made a periareolar incision in hide to the scar and tunnel to the seed using the lighted retractor.  I then removed the visible mass and I took the seed out separately and as it was on the margin of the mass.  This was marked with paint and passed off the table.  Mammogram was taken to confirm removal of the seed.  Hemostasis was observed.  I closed the breast tissue with 2-0 Vicryl.  The skin was closed with 3-0 Vicryl 5-0 Monocryl.  Glue and Steri-Strips were applied.  She tolerated this well was extubated and transferred to recovery stable

## 2019-07-16 NOTE — Anesthesia Preprocedure Evaluation (Addendum)
Anesthesia Evaluation  Patient identified by MRN, date of birth, ID band Patient awake    Reviewed: Allergy & Precautions, NPO status , Patient's Chart, lab work & pertinent test results  History of Anesthesia Complications (+) PONV and history of anesthetic complications  Airway Mallampati: I  TM Distance: >3 FB Neck ROM: Full    Dental no notable dental hx. (+) Teeth Intact, Dental Advisory Given   Pulmonary asthma ,    Pulmonary exam normal breath sounds clear to auscultation       Cardiovascular negative cardio ROS Normal cardiovascular exam Rhythm:Regular Rate:Normal     Neuro/Psych negative neurological ROS  negative psych ROS   GI/Hepatic negative GI ROS, Neg liver ROS,   Endo/Other  negative endocrine ROS  Renal/GU negative Renal ROS  negative genitourinary   Musculoskeletal negative musculoskeletal ROS (+)   Abdominal   Peds  Hematology negative hematology ROS (+)   Anesthesia Other Findings   Reproductive/Obstetrics                            Anesthesia Physical Anesthesia Plan  ASA: II  Anesthesia Plan: General   Post-op Pain Management:    Induction: Intravenous  PONV Risk Score and Plan: Ondansetron, Dexamethasone, Midazolam and Scopolamine patch - Pre-op  Airway Management Planned: LMA  Additional Equipment:   Intra-op Plan:   Post-operative Plan: Extubation in OR  Informed Consent: I have reviewed the patients History and Physical, chart, labs and discussed the procedure including the risks, benefits and alternatives for the proposed anesthesia with the patient or authorized representative who has indicated his/her understanding and acceptance.     Dental advisory given  Plan Discussed with: CRNA  Anesthesia Plan Comments:        Anesthesia Quick Evaluation

## 2019-07-17 NOTE — Anesthesia Postprocedure Evaluation (Signed)
Anesthesia Post Note  Patient: Shelley Walton  Procedure(s) Performed: RADIOACTIVE SEED GUIDED EXCISIONAL LEFT BREAST BIOPSY (Left Breast)     Patient location during evaluation: PACU Anesthesia Type: General Level of consciousness: awake and alert Pain management: pain level controlled Vital Signs Assessment: post-procedure vital signs reviewed and stable Respiratory status: spontaneous breathing, nonlabored ventilation, respiratory function stable and patient connected to nasal cannula oxygen Cardiovascular status: blood pressure returned to baseline and stable Postop Assessment: no apparent nausea or vomiting Anesthetic complications: no    Last Vitals:  Vitals:   07/16/19 1600 07/16/19 1645  BP: 112/68 119/65  Pulse: 85 68  Resp: 15 16  Temp:  37.1 C  SpO2: 98% 99%    Last Pain:  Vitals:   07/16/19 1645  TempSrc:   PainSc: 2                  Shelley Walton

## 2019-07-20 ENCOUNTER — Encounter (HOSPITAL_BASED_OUTPATIENT_CLINIC_OR_DEPARTMENT_OTHER): Payer: Self-pay | Admitting: General Surgery

## 2019-07-20 LAB — SURGICAL PATHOLOGY

## 2019-07-29 ENCOUNTER — Other Ambulatory Visit: Payer: Self-pay | Admitting: Physician Assistant

## 2019-07-29 DIAGNOSIS — J45909 Unspecified asthma, uncomplicated: Secondary | ICD-10-CM

## 2019-08-04 ENCOUNTER — Other Ambulatory Visit: Payer: Self-pay

## 2019-08-04 DIAGNOSIS — Z20822 Contact with and (suspected) exposure to covid-19: Secondary | ICD-10-CM

## 2019-08-05 ENCOUNTER — Encounter: Payer: Self-pay | Admitting: Physician Assistant

## 2019-08-05 LAB — NOVEL CORONAVIRUS, NAA: SARS-CoV-2, NAA: NOT DETECTED

## 2019-08-06 ENCOUNTER — Other Ambulatory Visit: Payer: Self-pay

## 2019-08-06 DIAGNOSIS — Z20822 Contact with and (suspected) exposure to covid-19: Secondary | ICD-10-CM

## 2019-08-08 LAB — NOVEL CORONAVIRUS, NAA: SARS-CoV-2, NAA: NOT DETECTED

## 2019-08-10 ENCOUNTER — Other Ambulatory Visit: Payer: Self-pay

## 2019-08-13 ENCOUNTER — Other Ambulatory Visit: Payer: Self-pay

## 2019-08-13 ENCOUNTER — Ambulatory Visit: Payer: BC Managed Care – PPO | Attending: Internal Medicine

## 2019-08-13 DIAGNOSIS — Z20822 Contact with and (suspected) exposure to covid-19: Secondary | ICD-10-CM

## 2019-08-14 LAB — NOVEL CORONAVIRUS, NAA: SARS-CoV-2, NAA: NOT DETECTED

## 2019-08-26 ENCOUNTER — Other Ambulatory Visit: Payer: Self-pay | Admitting: Physician Assistant

## 2019-08-26 DIAGNOSIS — J45909 Unspecified asthma, uncomplicated: Secondary | ICD-10-CM

## 2019-08-26 DIAGNOSIS — J309 Allergic rhinitis, unspecified: Secondary | ICD-10-CM

## 2019-09-16 ENCOUNTER — Ambulatory Visit: Payer: BC Managed Care – PPO | Attending: Internal Medicine

## 2019-09-16 DIAGNOSIS — Z20822 Contact with and (suspected) exposure to covid-19: Secondary | ICD-10-CM | POA: Insufficient documentation

## 2019-09-17 ENCOUNTER — Ambulatory Visit: Payer: Self-pay

## 2019-09-17 LAB — NOVEL CORONAVIRUS, NAA: SARS-CoV-2, NAA: NOT DETECTED

## 2019-09-17 NOTE — Telephone Encounter (Signed)
Patient called stating that yesterday she had a COVID-19 test that was negative today. She states that she had lost her taste and smell, has a sore throat,headache that comes and goes and is tired. She has a cough in the AM and PM. She does not have fever.  She has not been in contact with anyone positive for COVID-19. She has Hx of asthma.  She has not had her flu vaccine this year. Care advice read to patient.  She verbalized understanding of all information. Appointment scheduled for tomorrow for follow-up.  Patient understands how to schedule herself for testing. She will wait for advice of PCP.  Reason for Disposition . [1] HIGH RISK patient AND [2] influenza is widespread in the community AND [3] ONE OR MORE respiratory symptoms: cough, sore throat, runny or stuffy nose  Answer Assessment - Initial Assessment Questions 1. COVID-19 DIAGNOSIS: "Who made your Coronavirus (COVID-19) diagnosis?" "Was it confirmed by a positive lab test?" If not diagnosed by a HCP, ask "Are there lots of cases (community spread) where you live?" (See public health department website, if unsure)      Co 2. COVID-19 EXPOSURE: "Was there any known exposure to COVID before the symptoms began?" CDC Definition of close contact: within 6 feet (2 meters) for a total of 15 minutes or more over a 24-hour period.    none 3. ONSET: "When did the COVID-19 symptoms start?"      yesterday 4. WORST SYMPTOM: "What is your worst symptom?" (e.g., cough, fever, shortness of breath, muscle aches)    Sore throat and loss of taste 5. COUGH: "Do you have a cough?" If so, ask: "How bad is the cough?"       AM and PM 6. FEVER: "Do you have a fever?" If so, ask: "What is your temperature, how was it measured, and when did it start?"     No 7. RESPIRATORY STATUS: "Describe your breathing?" (e.g., shortness of breath, wheezing, unable to speak)      No 8. BETTER-SAME-WORSE: "Are you getting better, staying the same or getting worse  compared to yesterday?"  If getting worse, ask, "In what way?"     same 9. HIGH RISK DISEASE: "Do you have any chronic medical problems?" (e.g., asthma, heart or lung disease, weak immune system, obesity, etc.)     asthma 10. PREGNANCY: "Is there any chance you are pregnant?" "When was your last menstrual period?"      No, 2 weeks ago 11. OTHER SYMPTOMS: "Do you have any other symptoms?"  (e.g., chills, fatigue, headache, loss of smell or taste, muscle pain, sore throat; new loss of smell or taste especially support the diagnosis of COVID-19)      Fatigue,headache, taste, smell comes and goes  Protocols used: CORONAVIRUS (COVID-19) DIAGNOSED OR SUSPECTED-A-AH

## 2019-09-18 ENCOUNTER — Ambulatory Visit (INDEPENDENT_AMBULATORY_CARE_PROVIDER_SITE_OTHER): Payer: BC Managed Care – PPO | Admitting: Physician Assistant

## 2019-09-18 DIAGNOSIS — J069 Acute upper respiratory infection, unspecified: Secondary | ICD-10-CM

## 2019-09-18 NOTE — Progress Notes (Signed)
Patient: Shelley Walton Female    DOB: 08-Feb-1996   24 y.o.   MRN: [id number] Visit Date: 09/18/2019  Today's Provider: Trinna Post, PA-C   Chief Complaint  Patient presents with  . URI   Subjective:    Virtual Visit via Telephone Note  I connected with Alona Bene on 09/18/19 at  3:20 PM EST by telephone and verified that I am speaking with the correct person using two identifiers.  Location: Patient: Home Provider: Office   I discussed the limitations, risks, security and privacy concerns of performing an evaluation and management service by telephone and the availability of in person appointments. I also discussed with the patient that there may be a patient responsible charge related to this service. The patient expressed understanding and agreed to proceed.  URI  This is a new problem. The current episode started in the past 7 days. The problem has been unchanged. There has been no fever. Associated symptoms include coughing, headaches and a sore throat. Pertinent negatives include no congestion, nausea, rhinorrhea, sinus pain or wheezing. She has tried acetaminophen for the symptoms. The treatment provided mild relief.   Patient got tested on 09/16/2019 for COVID and it was negative. 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 She lost taste and sense of smell Wednesday morning. She had a massive headache on Wednesday as well as sore throat. She continues with loss of taste. Denies shortness of breath.   No Known Allergies   Current Outpatient Medications:  .  albuterol (VENTOLIN HFA) 108 (90 Base) MCG/ACT inhaler, Inhale 2 puffs into the lungs every 6 (six) hours as needed for wheezing or shortness of breath., Disp: 18 g, Rfl: 3 .  ethynodiol-ethinyl estradiol (KELNOR 1/35) 1-35 MG-MCG tablet, Take 1 tablet by mouth daily., Disp: 3 Package, Rfl: 3 .  fexofenadine (ALLEGRA) 180 MG tablet, Take 180 mg by mouth daily., Disp: , Rfl:    .  montelukast (SINGULAIR) 10 MG tablet, TAKE 1 TABLET(10 MG) BY MOUTH AT BEDTIME, Disp: 90 tablet, Rfl: 1 .  traMADol (ULTRAM) 50 MG tablet, Take 2 tablets (100 mg total) by mouth every 6 (six) hours as needed., Disp: 10 tablet, Rfl: 0 .  WIXELA INHUB 250-50 MCG/DOSE AEPB, INHALE 1 PUFF INTO THE LUNGS TWICE DAILY, Disp: 60 each, Rfl: 1  Review of Systems  Constitutional: Positive for fatigue. Negative for appetite change, chills and fever.  HENT: Positive for sore throat. Negative for congestion, rhinorrhea, sinus pressure and sinus pain.   Respiratory: Positive for cough. Negative for chest tightness, shortness of breath and wheezing.   Gastrointestinal: Negative for nausea.  Neurological: Positive for headaches. Negative for weakness.    Social History   Tobacco Use  . Smoking status: Never Smoker  . Smokeless tobacco: Never Used  Substance Use Topics  . Alcohol use: Yes    Comment: socially      Objective:   There were no vitals taken for this visit. There were no vitals filed for this visit.There is no height or weight on file to calculate BMI.   Physical Exam   No results found for any visits on 09/18/19.     Assessment & Plan    1. Upper respiratory tract infection, unspecified type  Counseled on OTC treatment with mucinex, delsym, NSAIDs. May call back in 10 days if not improving or if having sinusitis symptoms.   I discussed the assessment and treatment  plan with the patient. The patient was provided an opportunity to ask questions and all were answered. The patient agreed with the plan and demonstrated an understanding of the instructions.   The patient was advised to call back or seek an in-person evaluation if the symptoms worsen or if the condition fails to improve as anticipated.  I provided 15 minutes of non-face-to-face time during this encounter.  The entirety of the information documented in the History of Present Illness, Review of Systems and  Physical Exam were personally obtained by me. Portions of this information were initially documented by [facility] and reviewed by me for thoroughness and accuracy.      Trinna Post, PA-C  [facility]

## 2019-09-22 ENCOUNTER — Encounter: Payer: Self-pay | Admitting: Physician Assistant

## 2019-10-05 ENCOUNTER — Ambulatory Visit (INDEPENDENT_AMBULATORY_CARE_PROVIDER_SITE_OTHER): Payer: BC Managed Care – PPO | Admitting: Obstetrics and Gynecology

## 2019-10-05 ENCOUNTER — Encounter: Payer: Self-pay | Admitting: Obstetrics and Gynecology

## 2019-10-05 ENCOUNTER — Other Ambulatory Visit: Payer: Self-pay

## 2019-10-05 VITALS — BP 104/80 | Ht 64.0 in | Wt 166.0 lb

## 2019-10-05 DIAGNOSIS — N898 Other specified noninflammatory disorders of vagina: Secondary | ICD-10-CM

## 2019-10-05 LAB — POCT WET PREP WITH KOH
Clue Cells Wet Prep HPF POC: NEGATIVE
KOH Prep POC: NEGATIVE
Trichomonas, UA: NEGATIVE
Yeast Wet Prep HPF POC: NEGATIVE

## 2019-10-05 NOTE — Patient Instructions (Signed)
I value your feedback and entrusting us with your care. If you get a [facility] patient survey, I would appreciate you taking the time to let us know about your experience today. Thank you!  As of August 06, 2019, your lab results will be released to your MyChart immediately, before I even have a chance to see them. Please give me time to review them and contact you if there are any abnormalities. Thank you for your patience.  

## 2019-10-05 NOTE — Progress Notes (Signed)
Trinna Post, PA-C   Chief Complaint  Patient presents with  . Vaginal Discharge    odor, no itchiness or irritation x 1 week    HPI:      Ms. GWENDOLY BALDWIN is a 24 y.o. G0P0000 who LMP was Patient's last menstrual period was 09/07/2019 (approximate)., presents today for increased d/c with sour odor, no irritation for the past wk. No fishy odor. No meds to treat, no recent abx use. No new soaps/detergents, but uses scented detergent and dryer sheets. No LBP, pelvic pain, urin sx, fevers. She is sex active, no new partners. On OCPs.    Patient Active Problem List   Diagnosis Date Noted  . 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 Contraception, generic surveillance 06/20/2015  . History of chlamydia 06/09/2015  . Acne 02/24/2015  . Asthma 02/24/2015    Past Surgical History:  Procedure Laterality Date  . MASS EXCISION Right 10/14/2013   Procedure: RIGHT BREAST MASS EXCISION;  Surgeon: Rolm Bookbinder, MD;  Location: [facility];  Service: General;  Laterality: Right;  . RADIOACTIVE SEED GUIDED EXCISIONAL BREAST BIOPSY Left 07/16/2019   Procedure: RADIOACTIVE SEED GUIDED EXCISIONAL LEFT BREAST BIOPSY;  Surgeon: Rolm Bookbinder, MD;  Location: [facility];  Service: General;  Laterality: Left;  . WISDOM TOOTH EXTRACTION      Family History  Problem Relation Age of Onset  . Breast cancer Mother 55       myRisk neg  . Multiple sclerosis Mother   . Diabetes Maternal Grandfather   . Hypertension Maternal Grandfather   . Heart disease Maternal Grandfather   . Diabetes Maternal Grandmother   . Multiple sclerosis Maternal Uncle   . Healthy Brother   . Healthy Brother   . Breast cancer Other 42    Social History   Socioeconomic History  . Marital status: Single    Spouse name: Not on file  . Number of children: Not on file  . Years of education: Not on file  . Highest education level: Not on file  Occupational  History  . Occupation: Ship broker    Comment: Planning to go to [employer] in fall for [employer].   . Occupation: works at [employer]  . Smoking status: Never Smoker  . Smokeless tobacco: Never Used  Substance and Sexual Activity  . Alcohol use: Yes    Comment: socially  . Drug use: No  . Sexual activity: Yes    Birth control/protection: Pill  Other Topics Concern  . Not on file  Social History Narrative  . Not on file   Social Determinants of Health   Financial Resource Strain:   . Difficulty of Paying Living Expenses: Not on file  Food Insecurity:   . Worried About Charity fundraiser in the Last Year: Not on file  . Ran Out of Food in the Last Year: Not on file  Transportation Needs:   . Lack of Transportation (Medical): Not on file  . Lack of Transportation (Non-Medical): Not on file  Physical Activity:   . Days of Exercise per Week: Not on file  . Minutes of Exercise per Session: Not on file  Stress:   . Feeling of Stress : Not on file  Social Connections:   . Frequency of Communication with Friends and Family: Not on file  . Frequency of Social Gatherings with Friends and Family: Not on file  . Attends Religious Services: Not on  file  . Active Member of Clubs or Organizations: Not on file  . Attends Archivist Meetings: Not on file  . Marital Status: Not on file  Intimate Partner Violence:   . Fear of Current or Ex-Partner: Not on file  . Emotionally Abused: Not on file  . Physically Abused: Not on file  . Sexually Abused: Not on file    Outpatient Medications Prior to Visit  Medication Sig Dispense Refill  . albuterol (VENTOLIN HFA) 108 (90 Base) MCG/ACT inhaler Inhale 2 puffs into the lungs every 6 (six) hours as needed for wheezing or shortness of breath. 18 g 3  . ethynodiol-ethinyl estradiol (KELNOR 1/35) 1-35 MG-MCG tablet Take 1 tablet by mouth daily. 3 Package 3  . fexofenadine (ALLEGRA) 180 MG tablet Take 180 mg by mouth daily.      . montelukast (SINGULAIR) 10 MG tablet TAKE 1 TABLET(10 MG) BY MOUTH AT BEDTIME 90 tablet 1  . WIXELA INHUB 250-50 MCG/DOSE AEPB INHALE 1 PUFF INTO THE LUNGS TWICE DAILY 60 each 1  . traMADol (ULTRAM) 50 MG tablet Take 2 tablets (100 mg total) by mouth every 6 (six) hours as needed. 10 tablet 0   No facility-administered medications prior to visit.      ROS:  Review of Systems  Constitutional: Negative for fever.  Gastrointestinal: Negative for blood in stool, constipation, diarrhea, nausea and vomiting.  Genitourinary: Positive for vaginal discharge. Negative for dyspareunia, dysuria, flank pain, frequency, hematuria, urgency, vaginal bleeding and vaginal pain.  Musculoskeletal: Negative for back pain.  Skin: Negative for rash.   BREAST: No symptoms   OBJECTIVE:   Vitals:  BP 104/80   Ht 5\' 4"  (1.626 m)   Wt 166 lb (75.3 kg)   LMP 09/07/2019 (Approximate)   BMI 28.49 kg/m   Physical Exam Vitals reviewed.  Constitutional:      Appearance: She is well-developed.  Pulmonary:     Effort: Pulmonary effort is normal.  Genitourinary:    General: Normal vulva.     Pubic Area: No rash.      Labia:        Right: No rash, tenderness or lesion.        Left: No rash, tenderness or lesion.      Vagina: Normal. No vaginal discharge, erythema or tenderness.     Cervix: Normal.     Uterus: Normal. Not enlarged and not tender.      Adnexa: Right adnexa normal and left adnexa normal.       Right: No mass or tenderness.         Left: No mass or tenderness.    Musculoskeletal:        General: Normal range of motion.     Cervical back: Normal range of motion.  Skin:    General: Skin is warm and dry.  Neurological:     General: No focal deficit present.     Mental Status: She is alert and oriented to person, place, and time.  Psychiatric:        Mood and Affect: Mood normal.        Behavior: Behavior normal.        Thought Content: Thought content normal.        Judgment:  Judgment normal.     Results: Results for orders placed or performed in visit on 10/05/19 (from the past 24 hour(s))  POCT Wet Prep with KOH     Status: Normal   Collection Time: 10/05/19  5:19 PM  Result Value Ref Range   Trichomonas, UA Negative    Clue Cells Wet Prep HPF POC neg    Epithelial Wet Prep HPF POC     Yeast Wet Prep HPF POC neg    Bacteria Wet Prep HPF POC     RBC Wet Prep HPF POC     WBC Wet Prep HPF POC     KOH Prep POC Negative Negative     Assessment/Plan: Vaginal discharge - Plan: POCT Wet Prep with KOH; Neg wet prep/exam. Question normal d/c. Follow sx to see if they progress/resolve. D/C all scented detergents/dryer sheets. F/u prn.     No follow-ups on file.  Rasha Ibe B. Desjuan Stearns, PA-C 10/05/2019 5:21 PM

## 2019-10-18 ENCOUNTER — Other Ambulatory Visit: Payer: Self-pay | Admitting: Physician Assistant

## 2019-10-18 DIAGNOSIS — J45909 Unspecified asthma, uncomplicated: Secondary | ICD-10-CM

## 2019-12-18 ENCOUNTER — Encounter: Payer: Self-pay | Admitting: Obstetrics and Gynecology

## 2019-12-22 ENCOUNTER — Other Ambulatory Visit: Payer: Self-pay | Admitting: Physician Assistant

## 2019-12-22 DIAGNOSIS — J45909 Unspecified asthma, uncomplicated: Secondary | ICD-10-CM

## 2019-12-22 NOTE — Telephone Encounter (Signed)
Requested Prescriptions  Pending Prescriptions Disp Refills  . WIXELA INHUB 250-50 MCG/DOSE AEPB [Pharmacy Med Name: WIXELA INHUB DISKUS 250/50MCG 60S] 60 each 2    Sig: INHALE 1 PUFF INTO THE LUNGS TWICE DAILY     Pulmonology:  [employer] Passed - 12/22/2019  3:18 AM      Passed - Valid encounter within last 12 months    Recent Outpatient Visits          3 months ago Upper respiratory tract infection, unspecified type   [place], Adriana M, PA-C   7 months ago Uncomplicated asthma, unspecified asthma severity, unspecified whether persistent   [facility] [place], Wendee [place], [place]   1 year ago [place], [place], [place]   1 year ago Sore throat   [place], [place], [place]   1 year ago Acute non-recurrent frontal sinusitis   [facility] [place], Fabio Bering [place], [place]

## 2020-01-04 ENCOUNTER — Other Ambulatory Visit: Payer: Self-pay | Admitting: Obstetrics and Gynecology

## 2020-01-04 DIAGNOSIS — Z3041 Encounter for surveillance of contraceptive pills: Secondary | ICD-10-CM

## 2020-02-16 IMAGING — DX MM BREAST SURGICAL SPECIMEN
2 series · 4 of 4 positions shown · non-contrast
Comparison: Previous exam(s).

EXAM:
SPECIMEN RADIOGRAPH OF THE LEFT BREAST

[Series 2: specimen digital x-ray, derived · left · 2 of 2 slices shown (1 of 2)]
[im 1/2]
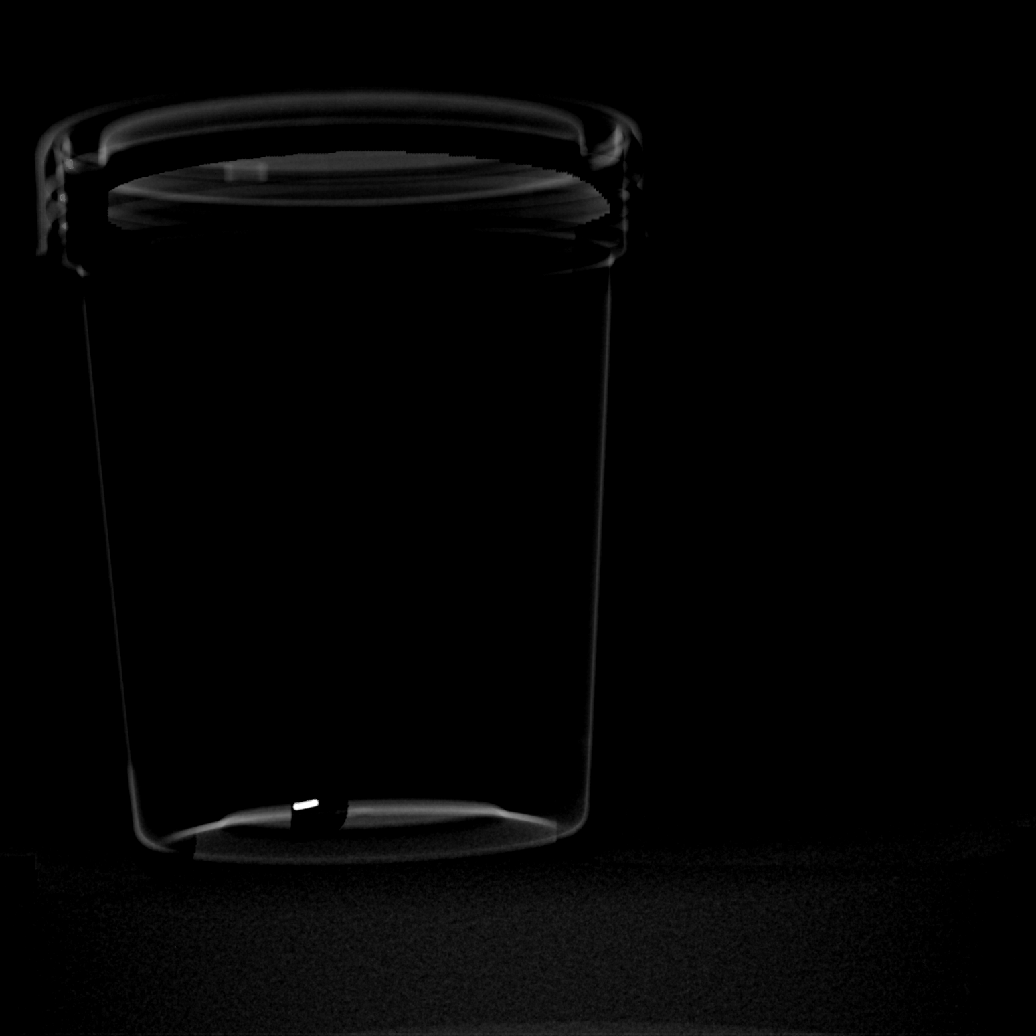
[im 2/2]
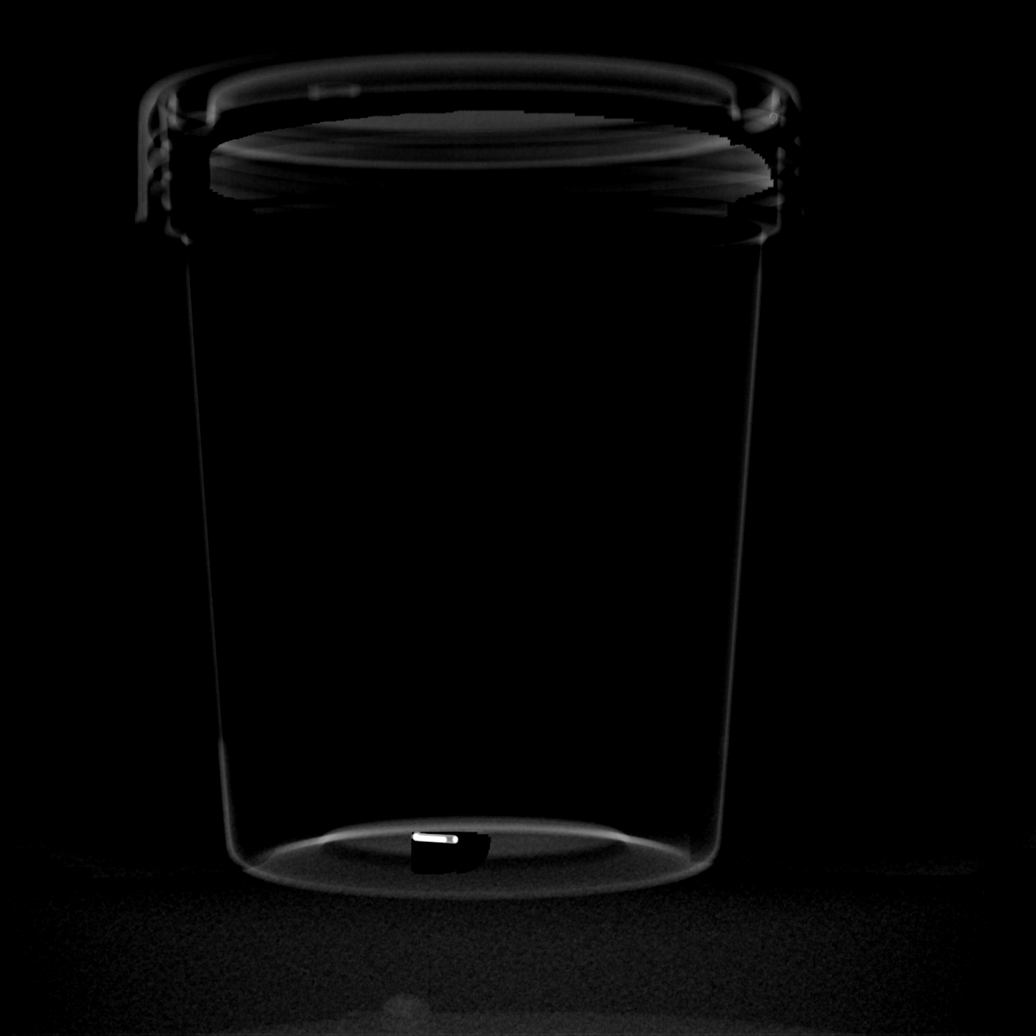

[Series 4: specimen digital x-ray, derived · left · 2 of 2 slices shown (2 of 2)]
[im 1/2]
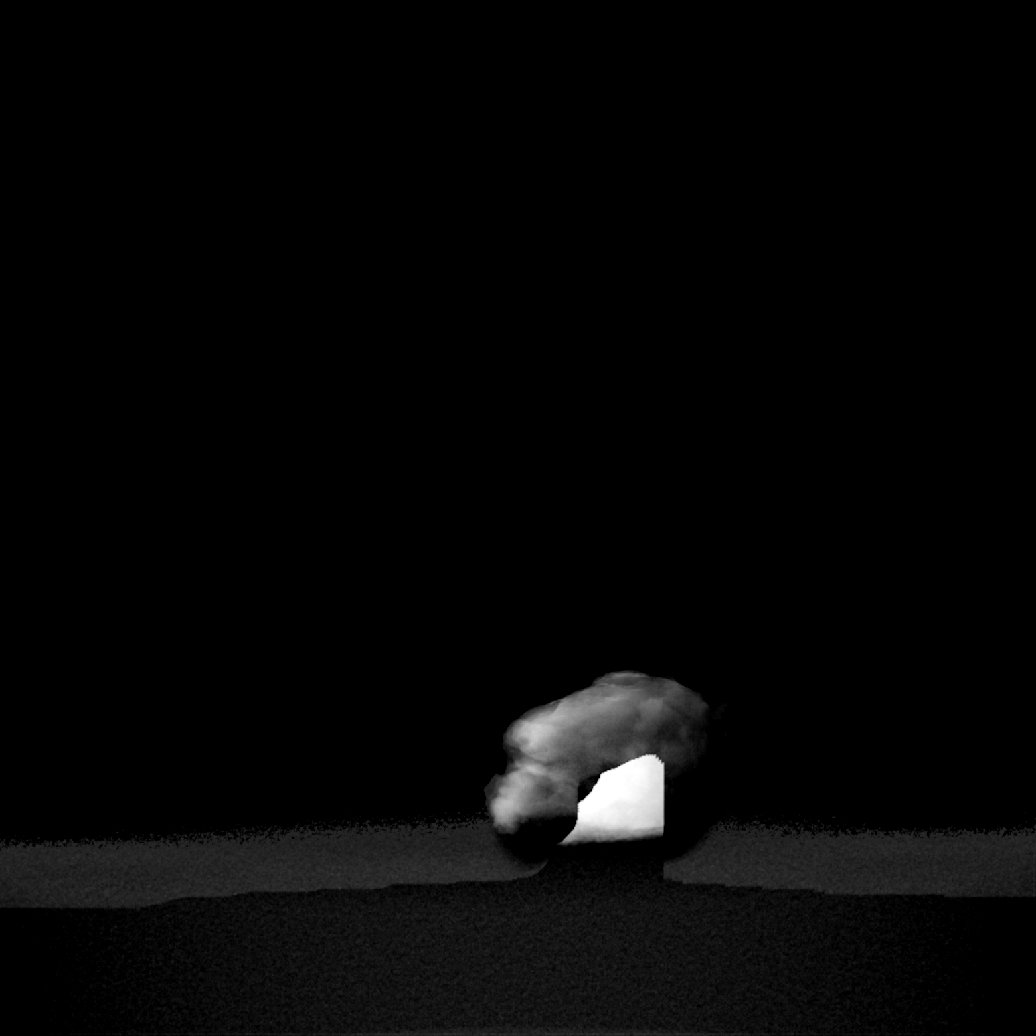
[im 2/2]
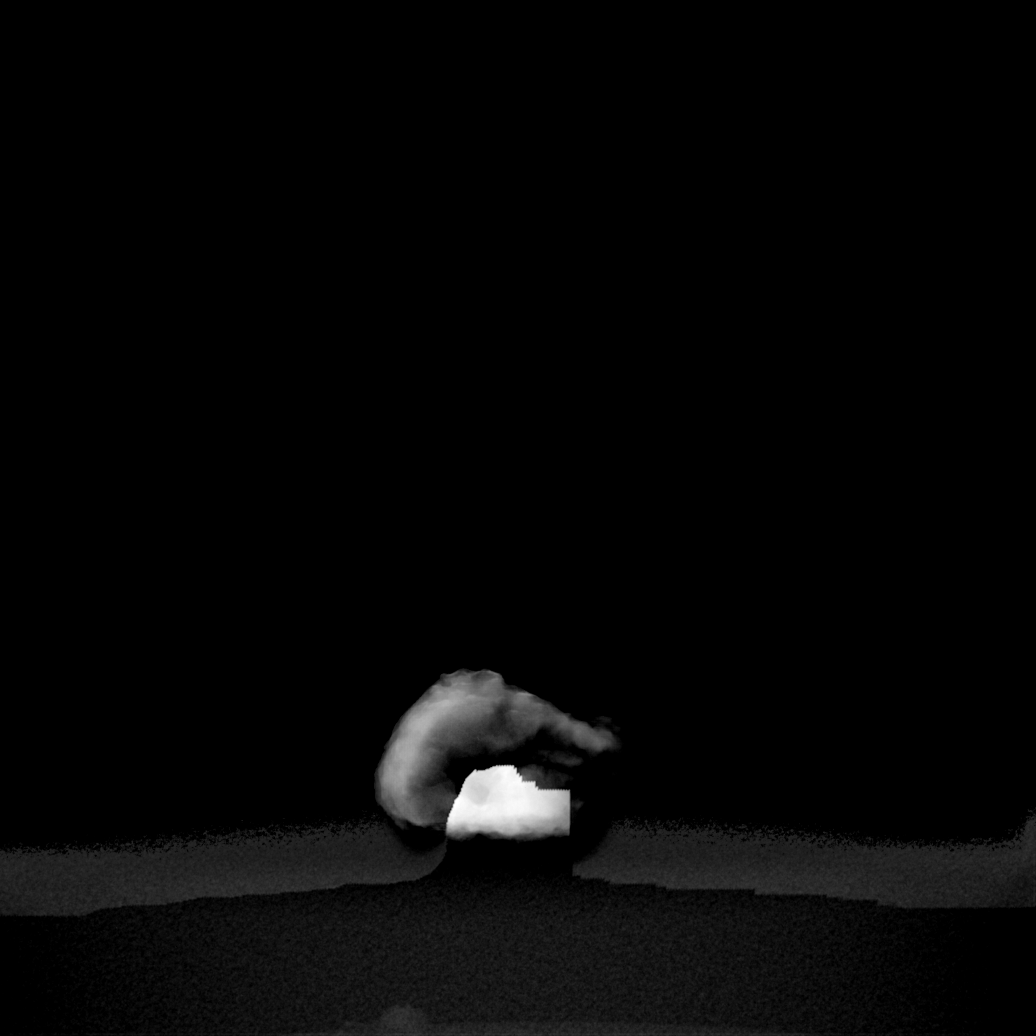

[4 of 4 positions shown; findings below may reference images not displayed]

FINDINGS: Status post excision of the left breast. The radioactive seed is
present, completely intact, and were marked for pathology. This mass
is never been biopsied in there is no biopsy clip.
IMPRESSION: Specimen radiograph of the left breast.

## 2020-03-12 ENCOUNTER — Other Ambulatory Visit: Payer: Self-pay | Admitting: Obstetrics and Gynecology

## 2020-03-12 ENCOUNTER — Other Ambulatory Visit: Payer: Self-pay | Admitting: Physician Assistant

## 2020-03-12 DIAGNOSIS — J309 Allergic rhinitis, unspecified: Secondary | ICD-10-CM

## 2020-03-12 DIAGNOSIS — Z3041 Encounter for surveillance of contraceptive pills: Secondary | ICD-10-CM

## 2020-03-12 DIAGNOSIS — J45909 Unspecified asthma, uncomplicated: Secondary | ICD-10-CM

## 2020-03-13 ENCOUNTER — Other Ambulatory Visit: Payer: Self-pay | Admitting: Physician Assistant

## 2020-03-13 DIAGNOSIS — Z3041 Encounter for surveillance of contraceptive pills: Secondary | ICD-10-CM

## 2020-03-13 NOTE — Telephone Encounter (Signed)
Please advise pt due for annual exam 30 day courtesy RF Requested Prescriptions  Pending Prescriptions Disp Refills  . ethynodiol-ethinyl estradiol (ZOVIA) 1-35 MG-MCG tablet [Pharmacy Med Name: ETHYNODIOL-ETH EST 1/35 TABLETS 28S] 28 tablet 0    Sig: TAKE 1 TABLET BY MOUTH DAILY     OB/GYN:  Contraceptives Passed - 03/13/2020  6:24 PM      Passed - Last BP in normal range    BP Readings from Last 1 Encounters:  10/05/19 104/80         Passed - Valid encounter within last 12 months    Recent Outpatient Visits          5 months ago Upper respiratory tract infection, unspecified type   [place], [place], PA-C   10 months ago Uncomplicated asthma, unspecified asthma severity, unspecified whether persistent   [facility] [place], Wendee Bea[place], [place]   1 year ago [place], [place]   1 year ago Sore throat   [place], [place], [place]   1 year ago Acute non-recurrent frontal sinusitis   [facility] [place], Fabio Bering [place], [place]

## 2020-03-18 ENCOUNTER — Ambulatory Visit: Payer: BC Managed Care – PPO | Attending: Internal Medicine

## 2020-03-18 DIAGNOSIS — Z20822 Contact with and (suspected) exposure to covid-19: Secondary | ICD-10-CM

## 2020-03-19 LAB — NOVEL CORONAVIRUS, NAA: SARS-CoV-2, NAA: NOT DETECTED

## 2020-03-19 LAB — SARS-COV-2, NAA 2 DAY TAT

## 2020-03-20 ENCOUNTER — Other Ambulatory Visit: Payer: Self-pay

## 2020-03-20 ENCOUNTER — Ambulatory Visit
Admission: RE | Admit: 2020-03-20 | Discharge: 2020-03-20 | Disposition: A | Discharge status: Home / Self Care | Payer: BC Managed Care – PPO | Source: Ambulatory Visit | Attending: Internal Medicine | Admitting: Internal Medicine

## 2020-03-20 VITALS — BP 120/75 | HR 74 | Temp 98.9°F | Resp 14 | Ht 64.0 in | Wt 150.0 lb

## 2020-03-20 DIAGNOSIS — U071 COVID-19: Secondary | ICD-10-CM | POA: Diagnosis not present

## 2020-03-20 DIAGNOSIS — Z20822 Contact with and (suspected) exposure to covid-19: Secondary | ICD-10-CM

## 2020-03-20 NOTE — ED Provider Notes (Signed)
[facility]    CSN: [id number] Arrival date & time: 03/20/20  1150      History   Chief Complaint Chief Complaint  Patient presents with  . Appointment  . Cough  . Sinus Problem  . Otalgia    HPI Shelley Walton is a 24 y.o. female. who presents with URI symptoms x 1 week. Has been taking OTC meds and is not helping her. She has been chilling, but no fever. Her normal temp is 96. Her boyfriend and all the friends they traveled with are positive for covid and the 2 test she has had in the past week says negative. The last one was 2 days ago. She feels her symptoms are getting worse. She has been taking Vit D3 and Zinc.   Past Medical History:  Diagnosis Date  . Asthma    daily inhaler  . 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 Donne Hazel  . PONV (postoperative nausea and vomiting)     Patient Active Problem List   Diagnosis Date Noted  . 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 Contraception, generic surveillance 06/20/2015  . History of chlamydia 06/09/2015  . Acne 02/24/2015  . Asthma 02/24/2015    Past Surgical History:  Procedure Laterality Date  . MASS EXCISION Right 10/14/2013   Procedure: RIGHT BREAST MASS EXCISION;  Surgeon: Rolm Bookbinder, MD;  Location: [facility];  Service: General;  Laterality: Right;  . RADIOACTIVE SEED GUIDED EXCISIONAL BREAST BIOPSY Left 07/16/2019   Procedure: RADIOACTIVE SEED GUIDED EXCISIONAL LEFT BREAST BIOPSY;  Surgeon: Rolm Bookbinder, MD;  Location: [facility];  Service: General;  Laterality: Left;  . WISDOM TOOTH EXTRACTION      OB History    Gravida  0   Para  0   Term  0   Preterm  0   AB  0   Living  0     SAB  0   TAB  0   Ectopic  0   Multiple  0   Live Births  0            Home Medications    Prior to Admission medications   Medication Sig Start Date End Date Taking? Authorizing Provider  albuterol (VENTOLIN HFA) 108 (90  Base) MCG/ACT inhaler Inhale 2 puffs into the lungs every 6 (six) hours as needed for wheezing or shortness of breath. 06/08/19  Yes Trinna Post, PA-C  ethynodiol-ethinyl estradiol (ZOVIA) 1-35 MG-MCG tablet TAKE 1 TABLET BY MOUTH DAILY 03/13/20  Yes Carles Collet M, PA-C  fexofenadine (ALLEGRA) 180 MG tablet Take 180 mg by mouth daily.   Yes [provider]  montelukast (SINGULAIR) 10 MG tablet TAKE 1 TABLET(10 MG) BY MOUTH AT BEDTIME 03/12/20  Yes Trinna Post, PA-C  WIXELA INHUB 250-50 MCG/DOSE AEPB INHALE 1 PUFF INTO THE LUNGS TWICE DAILY 12/22/19  Yes Trinna Post, PA-C    Family History Family History  Problem Relation Age of Onset  . Breast cancer Mother 57       myRisk neg  . Multiple sclerosis Mother   . Diabetes Maternal Grandfather   . Hypertension Maternal Grandfather   . Heart disease Maternal Grandfather   . Diabetes Maternal Grandmother   . Multiple sclerosis Maternal Uncle   . Healthy Brother   . Healthy Brother   . Breast cancer Other 51    Social History Social History   Tobacco Use  . Smoking  status: Never Smoker  . Smokeless tobacco: Never Used  Vaping Use  . Vaping Use: Never used  Substance Use Topics  . Alcohol use: Yes    Comment: socially  . Drug use: No     Allergies   Patient has no known allergies.   Review of Systems Review of Systems  Constitutional: Positive for appetite change, chills and fatigue. Negative for diaphoresis and fever.  HENT: Positive for congestion, postnasal drip, rhinorrhea and sore throat. Negative for ear discharge, ear pain and trouble swallowing.   Eyes: Negative for discharge.  Respiratory: Positive for cough. Negative for shortness of breath.   Gastrointestinal: Negative for abdominal pain, diarrhea, nausea and vomiting.  Musculoskeletal: Negative for back pain, gait problem and myalgias.  Skin: Negative for rash.  Neurological: Negative for headaches.  Hematological: Negative for  adenopathy.     Physical Exam Triage Vital Signs ED Triage Vitals  Enc Vitals Group     BP 03/20/20 1227 120/75     Pulse Rate 03/20/20 1227 74     Resp 03/20/20 1227 14     Temp 03/20/20 1227 98.9 F (37.2 C)     Temp Source 03/20/20 1227 Oral     SpO2 03/20/20 1227 98 %     Weight 03/20/20 1225 150 lb (68 kg)     Height 03/20/20 1225 5\' 4"  (1.626 m)     Head Circumference --      Peak Flow --      Pain Score 03/20/20 1225 2     Pain Loc --      Pain Edu? --      Excl. in [place]? --    No data found.  Updated Vital Signs BP 120/75 (BP Location: Right Arm)   Pulse 74   Temp 98.9 F (37.2 C) (Oral)   Resp 14   Ht 5\' 4"  (1.626 m)   Wt 150 lb (68 kg)   LMP 02/21/2020 (Approximate)   SpO2 98%   BMI 25.75 kg/m   Visual Acuity Right Eye Distance:   Left Eye Distance:   Bilateral Distance:    Right Eye Near:   Left Eye Near:    Bilateral Near:     Physical Exam Vitals and nursing note reviewed.  Constitutional:      General: She is not in acute distress.    Appearance: She is normal weight. She is not toxic-appearing.  HENT:     Head: Atraumatic.     Right Ear: Tympanic membrane, ear canal and external ear normal. There is no impacted cerumen.     Left Ear: Tympanic membrane, ear canal and external ear normal.     Nose: Congestion and rhinorrhea present.     Mouth/Throat:     Mouth: Mucous membranes are moist.     Comments: Mild erythema of pharynx noted Eyes:     General: No scleral icterus.    Conjunctiva/sclera: Conjunctivae normal.  Cardiovascular:     Rate and Rhythm: Normal rate and regular rhythm.  Pulmonary:     Effort: Pulmonary effort is normal.     Breath sounds: Normal breath sounds.  Musculoskeletal:        General: Normal range of motion.     Cervical back: Neck supple.  Lymphadenopathy:     Cervical: No cervical adenopathy.  Skin:    General: Skin is warm and dry.     Findings: No rash.  Neurological:     Mental Status: She is alert  and  oriented to person, place, and time.  Psychiatric:        Behavior: Behavior normal.        Thought Content: Thought content normal.        Judgment: Judgment normal.      UC Treatments / Results  Labs (all labs ordered are listed, but only abnormal results are displayed) Labs Reviewed  SARS CORONAVIRUS 2 (TAT 6-24 HRS)    EKG   Radiology No results found.  Procedures Procedures (including critical care time)  Medications Ordered in UC Medications - No data to display  Initial Impression / Assessment and Plan / UC Course  I have reviewed the triage vital signs and the nursing notes. Covid test is pending, in the mean time she was given instructions for supportive viral care. See instructions.     Final Clinical Impressions(s) / UC Diagnoses   Final diagnoses:  Suspected COVID-19 virus infection     Discharge Instructions     Take the following supplements to help your immune system be stronger to fight this viral infection Take Quarcetin 500 mg three times a day x 7 days with Zinc 50 mg ones a day x 7 days. The quarcetin is an antiviral and anti-inflammatory supplement which helps open the zinc channels in the cell to absorb Zinc. Zinc helps decrease the virus load in your body.  Also make sure to take Vit D 5,000 IU per day with a fatty meal and Vit C 1000 mg a day until you are completely better. Stay on Vitamin D 2,000 the rest of the season. Also take Melatonin 3-6 mg at bed time.  You may take any cold meds to help your symptoms Saline rinses will cleanse out the post nasal drainage that is making your throat sore.  Watch this video who to do saline nose rinses.  [email or web address]   Do not use tap water, only boiled water that has been cooled down.  Do it twice a day  for 5-7 days, but avoid bed time.        ED Prescriptions    None       PDMP not reviewed this encounter.   Shelby Mattocks, PA-C 03/20/20 1327

## 2020-03-20 NOTE — ED Triage Notes (Signed)
Patient c/o cough, sinus congestion, bilateral ear pain since Tuesday.  Patient denies fevers.  Patient states that she tested negative for COVID on Friday.

## 2020-03-20 NOTE — Discharge Instructions (Addendum)
Take the following supplements to help your immune system be stronger to fight this viral infection Take Quarcetin 500 mg three times a day x 7 days with Zinc 50 mg ones a day x 7 days. The quarcetin is an antiviral and anti-inflammatory supplement which helps open the zinc channels in the cell to absorb Zinc. Zinc helps decrease the virus load in your body.  Also make sure to take Vit D 5,000 IU per day with a fatty meal and Vit C 1000 mg a day until you are completely better. Stay on Vitamin D 2,000 the rest of the season. Also take Melatonin 3-6 mg at bed time.  You may take any cold meds to help your symptoms Saline rinses will cleanse out the post nasal drainage that is making your throat sore.  Watch this video who to do saline nose rinses.  [email or web address]   Do not use tap water, only boiled water that has been cooled down.  Do it twice a day  for 5-7 days, but avoid bed time.

## 2020-03-21 LAB — SARS CORONAVIRUS 2 (TAT 6-24 HRS): SARS Coronavirus 2: POSITIVE — AB

## 2020-03-22 ENCOUNTER — Other Ambulatory Visit: Payer: Self-pay | Admitting: Physician Assistant

## 2020-03-22 ENCOUNTER — Telehealth (INDEPENDENT_AMBULATORY_CARE_PROVIDER_SITE_OTHER): Payer: BC Managed Care – PPO | Admitting: Physician Assistant

## 2020-03-22 ENCOUNTER — Encounter: Payer: Self-pay | Admitting: Physician Assistant

## 2020-03-22 ENCOUNTER — Telehealth: Payer: Self-pay | Admitting: Adult Health

## 2020-03-22 DIAGNOSIS — U071 COVID-19: Secondary | ICD-10-CM | POA: Diagnosis not present

## 2020-03-22 DIAGNOSIS — J40 Bronchitis, not specified as acute or chronic: Secondary | ICD-10-CM | POA: Diagnosis not present

## 2020-03-22 DIAGNOSIS — J1282 Pneumonia due to coronavirus disease 2019: Secondary | ICD-10-CM

## 2020-03-22 DIAGNOSIS — Z3041 Encounter for surveillance of contraceptive pills: Secondary | ICD-10-CM

## 2020-03-22 MED ORDER — PREDNISONE 10 MG PO TABS: ORAL_TABLET | ORAL | 0 refills | Status: DC

## 2020-03-22 MED ORDER — ETHYNODIOL DIAC-ETH ESTRADIOL 1-35 MG-MCG PO TABS: 1.0000 | ORAL_TABLET | Freq: Every day | ORAL | 0 refills | Status: DC

## 2020-03-22 NOTE — Progress Notes (Addendum)
MyChart Video Visit    Virtual Visit via Video Note   This visit type was conducted due to national recommendations for restrictions regarding the COVID-19 Pandemic (e.g. social distancing) in an effort to limit this patient's exposure and mitigate transmission in our community. This patient is at least at moderate risk for complications without adequate follow up. This format is felt to be most appropriate for this patient at this time. Physical exam was limited by quality of the video and audio technology used for the visit.   Patient location: Home Provider location: Office    Patient: Shelley Walton   DOB: Jan 15, 1996   24 y.o. Female  MRN: [id number] Visit Date: 03/22/2020  Today's healthcare provider: Trinna Post, PA-C   Chief Complaint  Patient presents with  . Covid Exposure   Subjective    HPI  Patient tested positive for COVID on Sunday. She went to the urgent care.She has a history of asthma. She is using Albuterol inhaler. She is taking vitamin D and Zinc. She is having SOB, and chest tightness with some sharp pain/pulsing pain under her rib cage. She reports it happens once every hour. No fever.She has Asthma and wants to know what else she can do for it not to get worse. She first had symptoms since last Thursday 03/17/2020. She was not vaccinated.     Medications: Outpatient Medications Prior to Visit  Medication Sig  . albuterol (VENTOLIN HFA) 108 (90 Base) MCG/ACT inhaler Inhale 2 puffs into the lungs every 6 (six) hours as needed for wheezing or shortness of breath.  . ethynodiol-ethinyl estradiol (ZOVIA) 1-35 MG-MCG tablet TAKE 1 TABLET BY MOUTH DAILY  . fexofenadine (ALLEGRA) 180 MG tablet Take 180 mg by mouth daily.  . montelukast (SINGULAIR) 10 MG tablet TAKE 1 TABLET(10 MG) BY MOUTH AT BEDTIME  . WIXELA INHUB 250-50 MCG/DOSE AEPB INHALE 1 PUFF INTO THE LUNGS TWICE DAILY   No facility-administered medications prior to visit.    Review of Systems    Constitutional: Negative for fever.  Respiratory: Positive for chest tightness, shortness of breath and wheezing.       Objective    LMP 02/21/2020 (Approximate)    Physical Exam Constitutional:      Appearance: Normal appearance.  Pulmonary:     Effort: Pulmonary effort is normal.     Breath sounds: Normal breath sounds.  Skin:    General: Skin is warm and dry.  Neurological:     Mental Status: She is alert and oriented to person, place, and time. Mental status is at baseline.  Psychiatric:        Mood and Affect: Mood normal.        Behavior: Behavior normal.        Assessment & Plan    1. COVID-19  Your COVID test is positive. You should remain isolated and quarantine for at least 10 days from start of symptoms, or today if you are asymptomatic. You must be feeling better and be fever free without any fever reducers for at least 48 hours as well. Your household contacts should be tested as well as work contacts. If you feel worse or have increasing shortness of breath, you should be seen in person at urgent care or the emergency room. Advised to get vaccinated when feeling better.   Bilateral densities reflecting likely COVID Pneumonia. Will send in z-pak to cover for atypicals.  - DG Chest 2 View; Future - predniSONE (DELTASONE) 10 MG tablet;  Take 6 pills on day 1, 5 pills on day 2 and so on until complete.  Dispense: 21 tablet; Refill: 0  2. Bronchitis  Will treat as above. Offered infusion center at [facility] [place] [facility] but patient declines.   Return if symptoms worsen or fail to improve.     I discussed the assessment and treatment plan with the patient. The patient was provided an opportunity to ask questions and all were answered. The patient agreed with the plan and demonstrated an understanding of the instructions.   The patient was advised to call back or seek an in-person evaluation if the symptoms worsen or if the condition fails to improve as anticipated.   ITrinna Post, PA-C, have reviewed all documentation for this visit. The documentation on 03/22/20 for the exam, diagnosis, procedures, and orders are all accurate and complete.   Paulene Floor [facility] [phone] (phone) [phone] (fax)  [place]

## 2020-03-22 NOTE — Telephone Encounter (Signed)
Called patient and reviewed her eligibility for monoclonal antibody therapy for covid 19 as she is in at risk group due to her asthma and bmi of greater than 25.  She notes symptom onset of 03/17/2020.  She met with PCP today and started on steroid taper.  She will let us know tomorrow after she has a couple of doses of steroids on board whether or not she wants therapy.  She understands that it must be given in a 10 day window from symptom onset.  Wilber Bihari, NP

## 2020-03-25 ENCOUNTER — Ambulatory Visit
Admission: RE | Admit: 2020-03-25 | Discharge: 2020-03-25 | Disposition: A | Discharge status: Home / Self Care | Payer: BC Managed Care – PPO | Attending: Physician Assistant | Admitting: Physician Assistant

## 2020-03-25 ENCOUNTER — Other Ambulatory Visit: Payer: Self-pay

## 2020-03-25 ENCOUNTER — Ambulatory Visit
Admission: RE | Admit: 2020-03-25 | Discharge: 2020-03-25 | Disposition: A | Discharge status: Home / Self Care | Payer: BC Managed Care – PPO | Source: Ambulatory Visit | Attending: Physician Assistant | Admitting: Physician Assistant

## 2020-03-25 DIAGNOSIS — R05 Cough: Secondary | ICD-10-CM | POA: Diagnosis present

## 2020-03-25 DIAGNOSIS — U071 COVID-19: Secondary | ICD-10-CM | POA: Diagnosis not present

## 2020-03-25 MED ORDER — AZITHROMYCIN 250 MG PO TABS: ORAL_TABLET | ORAL | 0 refills | Status: DC

## 2020-03-25 NOTE — Addendum Note (Signed)
Addended by: Trinna Post on: 03/25/2020 04:26 PM   Modules accepted: Orders

## 2020-04-05 ENCOUNTER — Other Ambulatory Visit: Payer: Self-pay | Admitting: Physician Assistant

## 2020-04-05 DIAGNOSIS — J45909 Unspecified asthma, uncomplicated: Secondary | ICD-10-CM

## 2020-04-05 NOTE — Telephone Encounter (Signed)
Requested Prescriptions  Pending Prescriptions Disp Refills  . WIXELA INHUB 250-50 MCG/DOSE AEPB [Pharmacy Med Name: WIXELA INHUB DISKUS 250/50MCG 60S] 60 each 3    Sig: INHALE 1 PUFF INTO THE LUNGS TWICE DAILY     Pulmonology:  Combination Products Passed - 04/05/2020 10:00 PM      Passed - Valid encounter within last 12 months    Recent Outpatient Visits          2 weeks ago [place], [place], PA-C   6 months ago Upper respiratory tract infection, unspecified type   [place], [place], PA-C   11 months ago Uncomplicated asthma, unspecified asthma severity, unspecified whether persistent   [facility] [place], Wendee Beavers, [place]   1 year ago [place], [place], [place]   1 year ago Sore throat   [place], [place], [place]

## 2020-05-01 ENCOUNTER — Other Ambulatory Visit: Payer: Self-pay | Admitting: Physician Assistant

## 2020-05-01 DIAGNOSIS — Z3041 Encounter for surveillance of contraceptive pills: Secondary | ICD-10-CM

## 2020-05-03 ENCOUNTER — Other Ambulatory Visit: Payer: Self-pay | Admitting: Physician Assistant

## 2020-05-03 DIAGNOSIS — Z3041 Encounter for surveillance of contraceptive pills: Secondary | ICD-10-CM

## 2020-05-03 NOTE — Telephone Encounter (Signed)
Requested medication (s) are due for refill today: Yes  Requested medication (s) are on the active medication list: Yes  Last refill:  1 month ago  Future visit scheduled: Yes  Notes to clinic:  Unable to refill, noted to schedule OV. Will route to PCP for approval of another 30 day courtesy refill, patient will be out of medication after this week.     Requested Prescriptions  Pending Prescriptions Disp Refills   ethynodiol-ethinyl estradiol (ZOVIA) 1-35 MG-MCG tablet [Pharmacy Med Name: ETHYNODIOL-ETH EST 1/35 TABLETS 28S] 28 tablet 0    Sig: Take 1 tablet by mouth daily.      OB/GYN:  Contraceptives Passed - 05/03/2020  4:24 PM      Passed - Last BP in normal range    BP Readings from Last 1 Encounters:  03/20/20 120/75          Passed - Valid encounter within last 12 months    Recent Outpatient Visits           1 month ago [place], [place], [place]   7 months ago Upper respiratory tract infection, unspecified type   [place], [place], [place]   1 year ago Uncomplicated asthma, unspecified asthma severity, unspecified whether persistent   [facility] [place], Wendee Beavers, [place]   1 year ago [place], [place], [place]   1 year ago Sore throat   [facility] Trinna Post, [place]       Future Appointments             In 1 week Trinna Post, [place], [place]

## 2020-05-03 NOTE — Telephone Encounter (Signed)
Patient called and advised at her last refill it was noted to schedule OV. She says she will need the latest appointment. Appointment scheduled for Thursday, 05/12/20 at 1600 with Carles Collet, PA-C.

## 2020-05-09 NOTE — Telephone Encounter (Signed)
[name] was on 03/22/2020 an upcoming visit on 05/18/20.

## 2020-05-12 ENCOUNTER — Ambulatory Visit: Payer: Self-pay | Admitting: Physician Assistant

## 2020-05-18 ENCOUNTER — Encounter: Payer: Self-pay | Admitting: Physician Assistant

## 2020-05-18 ENCOUNTER — Other Ambulatory Visit: Payer: Self-pay

## 2020-05-18 ENCOUNTER — Ambulatory Visit: Payer: BC Managed Care – PPO | Admitting: Physician Assistant

## 2020-05-18 VITALS — BP 122/65 | HR 89 | Temp 99.3°F | Wt 165.0 lb

## 2020-05-18 DIAGNOSIS — F419 Anxiety disorder, unspecified: Secondary | ICD-10-CM

## 2020-05-18 DIAGNOSIS — U071 COVID-19: Secondary | ICD-10-CM

## 2020-05-18 DIAGNOSIS — J1282 Pneumonia due to coronavirus disease 2019: Secondary | ICD-10-CM

## 2020-05-18 DIAGNOSIS — R41840 Attention and concentration deficit: Secondary | ICD-10-CM

## 2020-05-18 DIAGNOSIS — Z30011 Encounter for initial prescription of contraceptive pills: Secondary | ICD-10-CM

## 2020-05-18 DIAGNOSIS — J45909 Unspecified asthma, uncomplicated: Secondary | ICD-10-CM

## 2020-05-18 DIAGNOSIS — Z3041 Encounter for surveillance of contraceptive pills: Secondary | ICD-10-CM

## 2020-05-18 MED ORDER — SERTRALINE HCL 50 MG PO TABS: 50.0000 mg | ORAL_TABLET | Freq: Every day | ORAL | 0 refills | Status: DC

## 2020-05-18 MED ORDER — ALBUTEROL SULFATE HFA 108 (90 BASE) MCG/ACT IN AERS: 2.0000 | INHALATION_SPRAY | Freq: Four times a day (QID) | RESPIRATORY_TRACT | 3 refills | Status: DC | PRN

## 2020-05-18 MED ORDER — ETHYNODIOL DIAC-ETH ESTRADIOL 1-35 MG-MCG PO TABS: 1.0000 | ORAL_TABLET | Freq: Every day | ORAL | 3 refills | Status: DC

## 2020-05-18 NOTE — Progress Notes (Signed)
Established patient visit   Patient: Shelley Walton   DOB: [birth year]   24 y.o. Female  MRN: [id number] Visit Date: 05/18/2020  Today's healthcare provider: Trinna Post, PA-C   Chief Complaint  Patient presents with  . Contraception  . Anxiety   Subjective    HPI  Follow up for contraception  She reports good compliance with treatment. She feels that condition is Unchanged. She is not having side effects.   Anxiety Patient wants to discuss possible treatment for anxiety. She reports that her symptoms have gotten worse. 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  PHQ-9 Scores PHQ9 SCORE ONLY 05/18/2020 03/29/2017  PHQ-9 Total Score 11 3   Attention Deficit: Has had attention issues since school, school was difficult, struggled with task orientation and procrastination and would finish last minute. Interrupts conversations frequently.      Medications: Outpatient Medications Prior to Visit  Medication Sig  . albuterol (VENTOLIN HFA) 108 (90 Base) MCG/ACT inhaler Inhale 2 puffs into the lungs every 6 (six) hours as needed for wheezing or shortness of breath.  . fexofenadine (ALLEGRA) 180 MG tablet Take 180 mg by mouth daily.  . montelukast (SINGULAIR) 10 MG tablet TAKE 1 TABLET(10 MG) BY MOUTH AT BEDTIME  . WIXELA INHUB 250-50 MCG/DOSE AEPB INHALE 1 PUFF INTO THE LUNGS TWICE DAILY  . [DISCONTINUED] ethynodiol-ethinyl estradiol (ZOVIA) 1-35 MG-MCG tablet TAKE 1 TABLET BY MOUTH DAILY  . [DISCONTINUED] azithromycin (ZITHROMAX) 250 MG tablet Take 2 pills on day 1. Take 1 pill each of the following four days.  . [DISCONTINUED] predniSONE (DELTASONE) 10 MG tablet Take 6 pills  on day 1, 5 pills on day 2 and so on until complete.   No facility-administered medications prior to visit.    Review of Systems  Constitutional: Negative.   Neurological: Negative for headaches.  Psychiatric/Behavioral: Positive for decreased concentration and sleep disturbance. Negative for self-injury. The patient is nervous/anxious.       Objective    BP 122/65   Pulse 89   Temp 99.3 F (37.4 C)   Wt 165 lb (74.8 kg)   BMI 28.32 kg/m    Physical Exam Constitutional:      Appearance: Normal appearance.  Cardiovascular:     Rate and Rhythm: Normal rate and regular rhythm.     Heart sounds: Normal heart sounds.  Pulmonary:     Effort: Pulmonary effort is normal.     Breath sounds: Normal breath sounds.  Skin:    General: Skin is warm and dry.  Neurological:     Mental Status: She is alert and oriented to person, place, and time. Mental status is at baseline.  Psychiatric:        Mood and Affect: Mood normal.        Behavior: Behavior normal.       No results found for any visits on 05/18/20.  Assessment & Plan    1. Anxiety  Will Start zoloft 50 mg daily Discussed potential side effects, incl GI upset, sexual dysfunction, increased anxiety, and SI Discussed that it can take 6-8 weeks to reach full efficacy Contracted for safety - no SI/HI Discussed synergistic effects  of medications and therapy  - sertraline (ZOLOFT) 50 MG tablet; Take 1 tablet (50 mg total) by mouth daily.  Dispense: 90 tablet; Refill: 0  2. Encounter for initial prescription of contraceptive pills   3. Attention deficit  - Ambulatory referral to Psychology  4. Pneumonia due to COVID-19 virus  - DG Chest 2 View; Future  5. Encounter for surveillance of contraceptive pills  - ethynodiol-ethinyl estradiol (ZOVIA) 1-35 MG-MCG tablet; Take 1 tablet by mouth daily.  Dispense: 84 tablet; Refill: 3    No follow-ups on file.      ITrinna Post, PA-C, have reviewed all  documentation for this visit. The documentation on 05/18/20 for the exam, diagnosis, procedures, and orders are all accurate and complete.  The entirety of the information documented in the History of Present Illness, Review of Systems and Physical Exam were personally obtained by me. Portions of this information were initially documented by Wilburt Finlay, CMA and reviewed by me for thoroughness and accuracy.      Paulene Floor  [facility] [phone] (phone) [phone] (fax)  [place]

## 2020-05-27 ENCOUNTER — Ambulatory Visit
Admission: RE | Admit: 2020-05-27 | Discharge: 2020-05-27 | Disposition: A | Discharge status: Home / Self Care | Payer: BC Managed Care – PPO | Attending: Physician Assistant | Admitting: Physician Assistant

## 2020-05-27 ENCOUNTER — Other Ambulatory Visit: Payer: Self-pay

## 2020-05-27 ENCOUNTER — Ambulatory Visit
Admission: RE | Admit: 2020-05-27 | Discharge: 2020-05-27 | Disposition: A | Discharge status: Home / Self Care | Payer: BC Managed Care – PPO | Source: Ambulatory Visit | Attending: Physician Assistant | Admitting: Physician Assistant

## 2020-05-27 DIAGNOSIS — J1282 Pneumonia due to coronavirus disease 2019: Secondary | ICD-10-CM | POA: Insufficient documentation

## 2020-05-27 DIAGNOSIS — U071 COVID-19: Secondary | ICD-10-CM | POA: Insufficient documentation

## 2020-06-08 ENCOUNTER — Other Ambulatory Visit: Payer: Self-pay | Admitting: Physician Assistant

## 2020-06-08 DIAGNOSIS — J45909 Unspecified asthma, uncomplicated: Secondary | ICD-10-CM

## 2020-06-08 DIAGNOSIS — J309 Allergic rhinitis, unspecified: Secondary | ICD-10-CM

## 2020-06-08 NOTE — Telephone Encounter (Signed)
Requested Prescriptions  Pending Prescriptions Disp Refills   montelukast (SINGULAIR) 10 MG tablet [Pharmacy Med Name: MONTELUKAST 10MG  TABLETS] 90 tablet 0    Sig: TAKE 1 TABLET(10 MG) BY MOUTH AT BEDTIME     Pulmonology:  Leukotriene Inhibitors Passed - 06/08/2020  3:10 AM      Passed - Valid encounter within last 12 months    Recent Outpatient Visits          3 weeks ago [place], Wendee Bea[place], [place]   2 months ago [place], [place], [place]   8 months ago Upper respiratory tract infection, unspecified type   [place], [place], PA-C   1 year ago Uncomplicated asthma, unspecified asthma severity, unspecified whether persistent   [facility] [place], Wendee Bea[place], [place]   1 year ago [place], Wend[place], [place]      Future Appointments            In 1 month Terrilee [place], Wendee Bea[place], [place], [place]

## 2020-07-19 ENCOUNTER — Ambulatory Visit: Payer: Self-pay | Admitting: Physician Assistant

## 2020-08-09 ENCOUNTER — Telehealth (INDEPENDENT_AMBULATORY_CARE_PROVIDER_SITE_OTHER): Payer: BC Managed Care – PPO | Admitting: Physician Assistant

## 2020-08-09 ENCOUNTER — Other Ambulatory Visit: Payer: Self-pay

## 2020-08-09 DIAGNOSIS — B3731 Acute candidiasis of vulva and vagina: Secondary | ICD-10-CM

## 2020-08-09 DIAGNOSIS — J0110 Acute frontal sinusitis, unspecified: Secondary | ICD-10-CM

## 2020-08-09 DIAGNOSIS — B373 Candidiasis of vulva and vagina: Secondary | ICD-10-CM | POA: Diagnosis not present

## 2020-08-09 MED ORDER — FLUCONAZOLE 150 MG PO TABS: ORAL_TABLET | ORAL | 0 refills | Status: DC

## 2020-08-09 MED ORDER — AMOXICILLIN-POT CLAVULANATE 875-125 MG PO TABS: 1.0000 | ORAL_TABLET | Freq: Two times a day (BID) | ORAL | 0 refills | Status: AC

## 2020-08-09 NOTE — Progress Notes (Signed)
MyChart Video Visit    Virtual Visit via Video Note   This visit type was conducted due to national recommendations for restrictions regarding the COVID-19 Pandemic (e.g. social distancing) in an effort to limit this patient's exposure and mitigate transmission in our community. This patient is at least at moderate risk for complications without adequate follow up. This format is felt to be most appropriate for this patient at this time. Physical exam was limited by quality of the video and audio technology used for the visit.   Patient location: Home Provider location: office   I discussed the limitations of evaluation and management by telemedicine and the availability of in person appointments. The patient expressed understanding and agreed to proceed.  Patient: Shelley Walton   DOB: 11/04/1995   24 y.o. Female  MRN: [id number] Visit Date: 08/09/2020  Today's healthcare provider: Trinna Post, PA-C   Chief Complaint  Patient presents with  . Sinusitis   Subjective    Sinusitis This is a new problem. The current episode started in the past 7 days (4 days). There has been no fever. Associated symptoms include congestion, headaches and sinus pressure. Past treatments include acetaminophen and oral decongestants. The treatment provided mild relief.    Has tried mucinex OTC which helped with symptoms.      Medications: Outpatient Medications Prior to Visit  Medication Sig  . albuterol (VENTOLIN HFA) 108 (90 Base) MCG/ACT inhaler Inhale 2 puffs into the lungs every 6 (six) hours as needed for wheezing or shortness of breath.  . ethynodiol-ethinyl estradiol (ZOVIA) 1-35 MG-MCG tablet Take 1 tablet by mouth daily.  . fexofenadine (ALLEGRA) 180 MG tablet Take 180 mg by mouth daily.  . montelukast (SINGULAIR) 10 MG tablet TAKE 1 TABLET(10 MG) BY MOUTH AT BEDTIME  . sertraline (ZOLOFT) 50 MG tablet Take 1 tablet (50 mg total) by mouth daily.  Grant Ruts INHUB 250-50 MCG/DOSE  AEPB INHALE 1 PUFF INTO THE LUNGS TWICE DAILY   No facility-administered medications prior to visit.    Review of Systems  HENT: Positive for congestion and sinus pressure.   Neurological: Positive for headaches.      Objective    There were no vitals taken for this visit.   Physical Exam Constitutional:      Appearance: Normal appearance.  Pulmonary:     Effort: Pulmonary effort is normal. No respiratory distress.  Neurological:     Mental Status: She is alert.  Psychiatric:        Mood and Affect: Mood normal.        Behavior: Behavior normal.        Assessment & Plan     1. Acute non-recurrent frontal sinusitis  Counseled she is in a viral window and must let the virus run its natural course. She did have COVID previously in 02/2020 so unlikely to have this currently. If her symptoms worsen, then she may start abx which I have sent in as I will be on vacation beginning 07/2019.  - amoxicillin-clavulanate (AUGMENTIN) 875-125 MG tablet; Take 1 tablet by mouth 2 (two) times daily for 7 days.  Dispense: 14 tablet; Refill: 0  2. Candida vaginitis  - fluconazole (DIFLUCAN) 150 MG tablet; Take one pill on day 1. Take 2nd pill three days later if still symptomatic.  Dispense: 2 tablet; Refill: 0   No follow-ups on file.     I discussed the assessment and treatment plan with the patient. The patient was provided an  opportunity to ask questions and all were answered. The patient agreed with the plan and demonstrated an understanding of the instructions.   The patient was advised to call back or seek an in-person evaluation if the symptoms worsen or if the condition fails to improve as anticipated.   ITrinna Post, PA-C, have reviewed all documentation for this visit. The documentation on 08/10/20 for the exam, diagnosis, procedures, and orders are all accurate and complete.  Marland KitchenSledge, [place] [phone]  (phone) [phone] (fax)  [place]

## 2020-08-09 NOTE — Patient Instructions (Signed)

## 2020-09-20 ENCOUNTER — Ambulatory Visit: Payer: BC Managed Care – PPO | Admitting: Obstetrics and Gynecology

## 2020-10-09 ENCOUNTER — Other Ambulatory Visit: Payer: Self-pay | Admitting: Physician Assistant

## 2020-10-09 DIAGNOSIS — J45909 Unspecified asthma, uncomplicated: Secondary | ICD-10-CM

## 2020-10-25 ENCOUNTER — Ambulatory Visit: Payer: Self-pay | Admitting: Obstetrics and Gynecology

## 2020-10-31 ENCOUNTER — Other Ambulatory Visit: Payer: Self-pay

## 2020-10-31 ENCOUNTER — Ambulatory Visit
Admission: EM | Admit: 2020-10-31 | Discharge: 2020-10-31 | Disposition: A | Discharge status: Home / Self Care | Payer: BC Managed Care – PPO | Attending: Emergency Medicine | Admitting: Emergency Medicine

## 2020-10-31 DIAGNOSIS — J029 Acute pharyngitis, unspecified: Secondary | ICD-10-CM

## 2020-10-31 LAB — GROUP A STREP BY PCR: Group A Strep by PCR: NOT DETECTED

## 2020-10-31 NOTE — ED Triage Notes (Addendum)
Pt c/o sore throat for several days, nasal congestion and slight cough. She does have hx of seasonal allergies. She reports fever of 101 yesterday. She has taken an at-home COVID test and it was negative. 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

## 2020-10-31 NOTE — ED Provider Notes (Signed)
[facility]    CSN: [id number] Arrival date & time: 10/31/20  0844      History   Chief Complaint Chief Complaint  Patient presents with  . Sore Throat  . Nasal Congestion    HPI Shelley Walton is a 25 y.o. female.   HPI   25 year old female here for evaluation of 3 days worth of sore throat, nasal congestion, and cough.  Patient reports that yesterday afternoon she spiked a fever to 101.  Patient took a home Covid test and that was negative and patient did have COVID in January.  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  Past Medical History:  Diagnosis Date  . Asthma    daily inhaler  . 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 Donne Hazel  . PONV (postoperative nausea and vomiting)     Patient Active Problem List   Diagnosis Date Noted  . 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 Contraception, generic surveillance 06/20/2015  . History of chlamydia 06/09/2015  . Acne 02/24/2015  . Asthma 02/24/2015    Past Surgical History:  Procedure Laterality Date  . MASS EXCISION Right 10/14/2013   Procedure: RIGHT BREAST MASS EXCISION;  Surgeon: Rolm Bookbinder, MD;  Location: [place];  Service: General;  Laterality: Right;  . RADIOACTIVE SEED GUIDED EXCISIONAL BREAST BIOPSY Left 07/16/2019   Procedure: RADIOACTIVE SEED GUIDED EXCISIONAL LEFT BREAST BIOPSY;  Surgeon: Rolm Bookbinder, MD;  Location: [place];  Service: General;  Laterality: Left;  . WISDOM TOOTH EXTRACTION      OB History    Gravida  0   Para  0   Term  0   Preterm  0   AB  0   Living  0     SAB  0   IAB  0   Ectopic  0   Multiple  0   Live Births  0            Home Medications    Prior to Admission medications   Medication Sig Start Date End Date Taking? Authorizing Provider  albuterol (VENTOLIN HFA) 108 (90 Base) MCG/ACT inhaler Inhale 2 puffs into the lungs every 6  (six) hours as needed for wheezing or shortness of breath. 05/18/20  Yes Trinna Post, PA-C  fexofenadine (ALLEGRA) 180 MG tablet Take 180 mg by mouth daily.   Yes [provider]  montelukast (SINGULAIR) 10 MG tablet TAKE 1 TABLET(10 MG) BY MOUTH AT BEDTIME 06/08/20  Yes Pollak, Adriana M, PA-C  sertraline (ZOLOFT) 50 MG tablet Take 1 tablet (50 mg total) by mouth daily. 05/18/20  Yes Trinna Post, PA-C  WIXELA INHUB 250-50 MCG/DOSE AEPB INHALE 1 PUFF INTO THE LUNGS TWICE DAILY 10/09/20  Yes Trinna Post, PA-C  ethynodiol-ethinyl estradiol (ZOVIA) 1-35 MG-MCG tablet Take 1 tablet by mouth daily. 05/18/20 10/31/20  Trinna Post, PA-C    Family History Family History  Problem Relation Age of Onset  . Breast cancer Mother 71       myRisk neg  . Multiple sclerosis Mother   . Diabetes Maternal Grandfather   . Hypertension Maternal Grandfather   . Heart disease Maternal Grandfather   . Diabetes Maternal Grandmother   . Multiple sclerosis Maternal Uncle   . Healthy Brother   . Healthy Brother   . Breast cancer Other 87    Social History Social History   Tobacco Use  .  Smoking status: Never Smoker  . Smokeless tobacco: Never Used  Vaping Use  . Vaping Use: Never used  Substance Use Topics  . Alcohol use: Yes    Comment: socially  . Drug use: No     Allergies   Patient has no known allergies.   Review of Systems Review of Systems  Constitutional: Positive for fever.  HENT: Positive for congestion, postnasal drip and sore throat. Negative for ear discharge, ear pain and rhinorrhea.   Respiratory: Positive for cough. Negative for shortness of breath and wheezing.   Gastrointestinal: Negative for diarrhea, nausea and vomiting.  Musculoskeletal: Negative for arthralgias and myalgias.  Neurological: Negative for headaches.  Hematological: Negative.   Psychiatric/Behavioral: Negative.      Physical Exam Triage Vital Signs ED Triage Vitals  Enc  Vitals Group     BP 10/31/20 0901 (!) 130/59     Pulse Rate 10/31/20 0901 83     Resp 10/31/20 0901 18     Temp 10/31/20 0901 98.2 F (36.8 C)     Temp Source 10/31/20 0901 Oral     SpO2 10/31/20 0901 97 %     Weight 10/31/20 0859 160 lb (72.6 kg)     Height 10/31/20 0859 5\' 4"  (1.626 m)     Head Circumference --      Peak Flow --      Pain Score 10/31/20 0859 8     Pain Loc --      Pain Edu? --      Excl. in [place]? --    No data found.  Updated Vital Signs BP (!) 130/59 (BP Location: Right Arm)   Pulse 83   Temp 98.2 F (36.8 C) (Oral)   Resp 18   Ht 5\' 4"  (1.626 m)   Wt 160 lb (72.6 kg)   LMP 10/17/2020 (Approximate)   SpO2 97%   BMI 27.46 kg/m   Visual Acuity Right Eye Distance:   Left Eye Distance:   Bilateral Distance:    Right Eye Near:   Left Eye Near:    Bilateral Near:     Physical Exam Vitals and nursing note reviewed.  Constitutional:      General: She is not in acute distress.    Appearance: Normal appearance. She is normal weight. She is not ill-appearing.  HENT:     Head: Normocephalic and atraumatic.     Right Ear: Tympanic membrane, ear canal and external ear normal.     Left Ear: Tympanic membrane, ear canal and external ear normal.     Nose: Congestion and rhinorrhea present.     Mouth/Throat:     Mouth: Mucous membranes are moist.     Pharynx: Oropharynx is clear. Posterior oropharyngeal erythema present. No oropharyngeal exudate.  Cardiovascular:     Rate and Rhythm: Normal rate and regular rhythm.     Pulses: Normal pulses.     Heart sounds: Normal heart sounds. No murmur heard. No gallop.   Pulmonary:     Effort: Pulmonary effort is normal.     Breath sounds: Normal breath sounds. No wheezing, rhonchi or rales.  Musculoskeletal:     Cervical back: Normal range of motion and neck supple.  Lymphadenopathy:     Cervical: No cervical adenopathy.  Skin:    General: Skin is warm and dry.     Capillary Refill: Capillary refill takes less  than 2 seconds.     Findings: No erythema.  Neurological:     General: No  focal deficit present.     Mental Status: She is alert and oriented to person, place, and time.  Psychiatric:        Mood and Affect: Mood normal.        Behavior: Behavior normal.        Thought Content: Thought content normal.        Judgment: Judgment normal.      UC Treatments / Results  Labs (all labs ordered are listed, but only abnormal results are displayed) Labs Reviewed  GROUP A STREP BY PCR  AEROBIC CULTURE W GRAM STAIN (SUPERFICIAL SPECIMEN)    EKG   Radiology No results found.  Procedures Procedures (including critical care time)  Medications Ordered in UC Medications - No data to display  Initial Impression / Assessment and Plan / UC Course  I have reviewed the triage vital signs and the nursing notes.  Pertinent labs & imaging results that were available during my care of the patient were reviewed by me and considered in my medical decision making (see chart for details).   Patient is a very pleasant 25 year old female here for evaluation of upper respiratory symptoms that been going on for last 3 days.  Patient's physical exam reveals clear external auditory canals bilaterally with bilateral pearly gray tympanic membranes with a normal light reflex.  Nasal mucosa is erythematous and edematous with scant clear nasal discharge.  Oropharynx reveals erythematous and edematous tonsillar pillars without exudate.  Posterior oropharynx also has erythema and clear postnasal drip.  No cervical lymphadenopathy appreciated.  Lungs are clear to auscultation all fields.  Patient had COVID in January so do not suspect reinfection.  Will swab patient for strep and if negative send throat culture.  PCR is negative we will send throat culture.  We will discharge patient home with a diagnosis of pharyngitis and treat with supportive therapy.  Work note provided.   Final Clinical Impressions(s) / UC  Diagnoses   Final diagnoses:  Pharyngitis, unspecified etiology     Discharge Instructions     Your strep PCR was negative today in clinic so we will send a culture swab to see if anything bacterial grows out.  Continue to use over-the-counter Tylenol and ibuprofen as needed for pain and discomfort.  Gargle with warm salt water 2-3 times a day to help soothe your tissues and also aid in pain relief.  If you remain fever free you are cleared to return to work tomorrow.  We will call you if there is anything positive on your throat culture and treat it accordingly.    ED Prescriptions    None     PDMP not reviewed this encounter.   Margarette [place], NP 10/31/20 1014

## 2020-10-31 NOTE — Discharge Instructions (Addendum)
Your strep PCR was negative today in clinic so we will send a culture swab to see if anything bacterial grows out.  Continue to use over-the-counter Tylenol and ibuprofen as needed for pain and discomfort.  Gargle with warm salt water 2-3 times a day to help soothe your tissues and also aid in pain relief.  If you remain fever free you are cleared to return to work tomorrow.  We will call you if there is anything positive on your throat culture and treat it accordingly.

## 2020-11-02 LAB — AEROBIC CULTURE W GRAM STAIN (SUPERFICIAL SPECIMEN)
Culture: NORMAL
Gram Stain: NONE SEEN
Special Requests: NORMAL

## 2020-11-16 ENCOUNTER — Ambulatory Visit (INDEPENDENT_AMBULATORY_CARE_PROVIDER_SITE_OTHER): Payer: BC Managed Care – PPO | Admitting: Obstetrics and Gynecology

## 2020-11-16 ENCOUNTER — Other Ambulatory Visit (HOSPITAL_COMMUNITY)
Admission: RE | Admit: 2020-11-16 | Discharge: 2020-11-16 | Disposition: A | Discharge status: Home / Self Care | Payer: BC Managed Care – PPO | Source: Ambulatory Visit | Attending: Obstetrics and Gynecology | Admitting: Obstetrics and Gynecology

## 2020-11-16 ENCOUNTER — Encounter: Payer: Self-pay | Admitting: Obstetrics and Gynecology

## 2020-11-16 ENCOUNTER — Other Ambulatory Visit: Payer: Self-pay

## 2020-11-16 VITALS — BP 104/70 | Ht 64.0 in | Wt 171.0 lb

## 2020-11-16 DIAGNOSIS — Z9189 Other specified personal risk factors, not elsewhere classified: Secondary | ICD-10-CM

## 2020-11-16 DIAGNOSIS — Z113 Encounter for screening for infections with a predominantly sexual mode of transmission: Secondary | ICD-10-CM | POA: Insufficient documentation

## 2020-11-16 DIAGNOSIS — Z803 Family history of malignant neoplasm of breast: Secondary | ICD-10-CM | POA: Diagnosis not present

## 2020-11-16 DIAGNOSIS — Z01419 Encounter for gynecological examination (general) (routine) without abnormal findings: Secondary | ICD-10-CM | POA: Diagnosis not present

## 2020-11-16 DIAGNOSIS — Z3041 Encounter for surveillance of contraceptive pills: Secondary | ICD-10-CM | POA: Diagnosis not present

## 2020-11-16 MED ORDER — DROSPIRENONE-ETHINYL ESTRADIOL 3-0.02 MG PO TABS: 1.0000 | ORAL_TABLET | Freq: Every day | ORAL | 3 refills | Status: DC

## 2020-11-16 NOTE — Patient Instructions (Signed)
I value your feedback and you entrusting us with your care. If you get a [facility] patient survey, I would appreciate you taking the time to let us know about your experience today. Thank you! ? ? ?

## 2020-11-16 NOTE — Progress Notes (Signed)
PCP:  Trinna Post, PA-C   Chief Complaint  Patient presents with  . Gynecologic Exam    No concerns     HPI:      Shelley Walton is a 25 y.o. G0P0000 who LMP was Patient's last menstrual period was 10/17/2020 (approximate)., presents today for her annual examination.  Her menses are regular every 28-30 days, lasting 5-6 days.  Dysmenorrhea mild. She does not have intermenstrual bleeding. Stopped OCPs 1/22 due to mood changes with zovia. Feels better off pills but wants BC.  Sex activity: single partner, contraception - OCP (estrogen/progesterone) stopped 1/22. Condoms now. Would like to try different OCP Last Pap: May 02, 2018  Results were: no abnormalities  Hx of STDs: none; tested 9/19 but would like testing again today  There is a FH of breast cancer in her mom and distant mat relatives. Her mom is MyRisk neg. There is no FH of ovarian cancer. The patient does do self-breast exams. She is s/p RT breast fibroadenoma exc 2016 and LT breast bx 11/20 with Dr. Donne Hazel. No other breast issues.  Tobacco use: The patient denies current or previous tobacco use. Alcohol use: social No drug use.  Exercise: moderately active  She does get adequate calcium and Vitamin D in her diet.  Gardasil declined.  Labs with PCP.   Past Medical History:  Diagnosis Date  . Asthma    daily inhaler  . Family history of breast cancer   . Increased risk of breast cancer   . 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 Donne Hazel  . PONV (postoperative nausea and vomiting)     Past Surgical History:  Procedure Laterality Date  . MASS EXCISION Right 10/14/2013   Procedure: RIGHT BREAST MASS EXCISION;  Surgeon: Rolm Bookbinder, MD;  Location: [place];  Service: General;  Laterality: Right;  . RADIOACTIVE SEED GUIDED EXCISIONAL BREAST BIOPSY Left 07/16/2019   Procedure: RADIOACTIVE SEED GUIDED EXCISIONAL LEFT BREAST BIOPSY;  Surgeon: Rolm Bookbinder,  MD;  Location: [place];  Service: General;  Laterality: Left;  . WISDOM TOOTH EXTRACTION      Family History  Problem Relation Age of Onset  . Breast cancer Mother 73       myRisk neg  . Multiple sclerosis Mother   . Diabetes Maternal Grandfather   . Hypertension Maternal Grandfather   . Heart disease Maternal Grandfather   . Diabetes Maternal Grandmother   . Multiple sclerosis Maternal Uncle   . Healthy Brother   . Healthy Brother   . Breast cancer Other 48    Social History   Socioeconomic History  . Marital status: Single    Spouse name: Not on file  . Number of children: Not on file  . Years of education: Not on file  . Highest education level: Not on file  Occupational History  . Occupation: Ship broker    Comment: Planning to go to [employer] in fall for [employer].   . Occupation: works at [employer]  . Smoking status: Never Smoker  . Smokeless tobacco: Never Used  Vaping Use  . Vaping Use: Never used  Substance and Sexual Activity  . Alcohol use: Yes    Comment: socially  . Drug use: No  . Sexual activity: Yes    Birth control/protection: None, Condom  Other Topics Concern  . Not on file  Social History Narrative  . Not on file   Social Determinants of  Health   Financial Resource Strain: Not on file  Food Insecurity: Not on file  Transportation Needs: Not on file  Physical Activity: Not on file  Stress: Not on file  Social Connections: Not on file  Intimate Partner Violence: Not on file    Outpatient Medications Prior to Visit  Medication Sig Dispense Refill  . albuterol (VENTOLIN HFA) 108 (90 Base) MCG/ACT inhaler Inhale 2 puffs into the lungs every 6 (six) hours as needed for wheezing or shortness of breath. 18 g 3  . fexofenadine (ALLEGRA) 180 MG tablet Take 180 mg by mouth daily.    . montelukast (SINGULAIR) 10 MG tablet TAKE 1 TABLET(10 MG) BY MOUTH AT BEDTIME 90 tablet 3  . sertraline (ZOLOFT) 50 MG tablet Take 1  tablet (50 mg total) by mouth daily. 90 tablet 0  . WIXELA INHUB 250-50 MCG/DOSE AEPB INHALE 1 PUFF INTO THE LUNGS TWICE DAILY 60 each 5   No facility-administered medications prior to visit.      ROS:  Review of Systems  Constitutional: Negative for fatigue, fever and unexpected weight change.  Respiratory: Negative for cough, shortness of breath and wheezing.   Cardiovascular: Negative for chest pain, palpitations and leg swelling.  Gastrointestinal: Negative for blood in stool, constipation, diarrhea, nausea and vomiting.  Endocrine: Negative for cold intolerance, heat intolerance and polyuria.  Genitourinary: Negative for dyspareunia, dysuria, flank pain, frequency, genital sores, hematuria, menstrual problem, pelvic pain, urgency, vaginal bleeding, vaginal discharge and vaginal pain.  Musculoskeletal: Negative for back pain, joint swelling and myalgias.  Skin: Negative for rash.  Neurological: Negative for dizziness, syncope, light-headedness, numbness and headaches.  Hematological: Negative for adenopathy.  Psychiatric/Behavioral: Negative for agitation, confusion, sleep disturbance and suicidal ideas. The patient is not nervous/anxious.   BREAST: No symptoms   Objective: BP 104/70   Ht 5\' 4"  (1.626 m)   Wt 171 lb (77.6 kg)   LMP 10/17/2020 (Approximate)   BMI 29.35 kg/m    Physical Exam Constitutional:      Appearance: She is well-developed.  Genitourinary:     Vulva normal.     Right Labia: No rash, tenderness or lesions.    Left Labia: No tenderness, lesions or rash.    No vaginal discharge, erythema or tenderness.      Right Adnexa: not tender and no mass present.    Left Adnexa: not tender and no mass present.    No cervical friability or polyp.     Uterus is not enlarged or tender.  Breasts:     Right: No mass, nipple discharge, skin change or tenderness.     Left: No mass, nipple discharge, skin change or tenderness.    Neck:     Thyroid: No  thyromegaly.  Cardiovascular:     Rate and Rhythm: Normal rate and regular rhythm.     Heart sounds: Normal heart sounds. No murmur heard.   Pulmonary:     Effort: Pulmonary effort is normal.     Breath sounds: Normal breath sounds.  Abdominal:     Palpations: Abdomen is soft.     Tenderness: There is no abdominal tenderness. There is no guarding or rebound.  Musculoskeletal:        General: Normal range of motion.     Cervical back: Normal range of motion.  Lymphadenopathy:     Cervical: No cervical adenopathy.  Neurological:     General: No focal deficit present.     Mental Status: She is alert and oriented to  person, place, and time.     Cranial Nerves: No cranial nerve deficit.  Skin:    General: Skin is warm and dry.  Psychiatric:        Mood and Affect: Mood normal.        Behavior: Behavior normal.        Thought Content: Thought content normal.        Judgment: Judgment normal.  Vitals reviewed.     Assessment/Plan: Encounter for annual routine gynecological examination  Screening for STD (sexually transmitted disease) - Plan: Cervicovaginal ancillary only  Encounter for surveillance of contraceptive pills - Plan: drospirenone-ethinyl estradiol (YAZ) 3-0.02 MG tablet; OCP change to yaz due to mood changes. Rx eRxd. Start with menses, condoms for 1 wk. Also discussed kyleena. F/u prn.   Family history of breast cancer--pt's mom is MyRisk neg  Increased risk of breast cancer--no TC model done but increased risk due to [place]. As of now, start mammos age 61.    Meds ordered this encounter  Medications  . drospirenone-ethinyl estradiol (YAZ) 3-0.02 MG tablet    Sig: Take 1 tablet by mouth daily.    Dispense:  84 tablet    Refill:  3    Order Specific Question:   Supervising Provider    Answer:   Gae Dry [[id number]]             GYN counsel adequate intake of calcium and vitamin D, diet and exercise     F/U  Return in about 1 year (around  11/16/2021).  Ranjit Ashurst B. Ahman Dugdale, PA-C 11/16/2020 4:27 PM

## 2020-11-18 LAB — CERVICOVAGINAL ANCILLARY ONLY
Chlamydia: NEGATIVE
Comment: NEGATIVE
Comment: NORMAL
Neisseria Gonorrhea: NEGATIVE

## 2020-12-07 ENCOUNTER — Encounter: Payer: Self-pay | Admitting: Physician Assistant

## 2020-12-07 ENCOUNTER — Other Ambulatory Visit: Payer: Self-pay | Admitting: *Deleted

## 2020-12-07 DIAGNOSIS — A6000 Herpesviral infection of urogenital system, unspecified: Secondary | ICD-10-CM

## 2020-12-07 MED ORDER — VALACYCLOVIR HCL 1 G PO TABS: ORAL_TABLET | ORAL | 5 refills | Status: DC

## 2021-01-19 ENCOUNTER — Telehealth: Payer: Self-pay | Admitting: Physician Assistant

## 2021-01-19 NOTE — Telephone Encounter (Signed)
[facility] faxed refill request for the following medications:  Fluconazole 150 mg tablets  Last Rx: 10/11/19 Qty: 2 Refills: 0 LOV: 08/09/20 with Adriana Please advise. Thanks TNP

## 2021-01-20 MED ORDER — FLUCONAZOLE 150 MG PO TABS: 150.0000 mg | ORAL_TABLET | Freq: Once | ORAL | 0 refills | Status: AC

## 2021-01-20 NOTE — Telephone Encounter (Signed)
Please advise. Patient is requesting a prescription for fluconazole. She is having signs of yeast infection, with white cloudy discharge and itching.

## 2021-01-20 NOTE — Telephone Encounter (Signed)
Copied from [place] [phone]. Topic: General - Other >> Jan 20, 2021  3:08 PM Yvette Rack wrote: Reason for CRM: Pt returned Rachelle call and reports having signs of yeast infection such as cloudy discharge and itching.

## 2021-01-20 NOTE — Telephone Encounter (Signed)
Called patient to see if she was having symptoms. Med is not listed on med list. Ok for [facility] to ask patient.

## 2021-02-03 ENCOUNTER — Other Ambulatory Visit: Payer: Self-pay

## 2021-02-07 ENCOUNTER — Encounter: Payer: Self-pay | Admitting: Obstetrics

## 2021-02-07 ENCOUNTER — Ambulatory Visit (INDEPENDENT_AMBULATORY_CARE_PROVIDER_SITE_OTHER): Payer: BC Managed Care – PPO | Admitting: Obstetrics

## 2021-02-07 ENCOUNTER — Other Ambulatory Visit: Payer: Self-pay

## 2021-02-07 ENCOUNTER — Other Ambulatory Visit: Payer: Self-pay | Admitting: Obstetrics

## 2021-02-07 ENCOUNTER — Other Ambulatory Visit (HOSPITAL_COMMUNITY)
Admission: RE | Admit: 2021-02-07 | Discharge: 2021-02-07 | Disposition: A | Discharge status: Home / Self Care | Payer: BC Managed Care – PPO | Source: Ambulatory Visit | Attending: Obstetrics | Admitting: Obstetrics

## 2021-02-07 VITALS — BP 100/60 | Wt 154.0 lb

## 2021-02-07 DIAGNOSIS — Z3480 Encounter for supervision of other normal pregnancy, unspecified trimester: Secondary | ICD-10-CM

## 2021-02-07 DIAGNOSIS — N912 Amenorrhea, unspecified: Secondary | ICD-10-CM

## 2021-02-07 DIAGNOSIS — Z113 Encounter for screening for infections with a predominantly sexual mode of transmission: Secondary | ICD-10-CM | POA: Insufficient documentation

## 2021-02-07 LAB — POCT URINALYSIS DIPSTICK OB
Bilirubin, UA: NEGATIVE
Blood, UA: NEGATIVE
Glucose, UA: NEGATIVE
Ketones, UA: NEGATIVE
Nitrite, UA: NEGATIVE
POC,PROTEIN,UA: NEGATIVE
Urobilinogen, UA: 0.2 E.U./dL
pH, UA: 5 (ref 5.0–8.0)

## 2021-02-07 LAB — POCT URINE PREGNANCY: Preg Test, Ur: POSITIVE — AB

## 2021-02-07 NOTE — Progress Notes (Signed)
New Obstetric Patient H&P    Chief Complaint: "Desires prenatal care"   History of Present Illness: Patient is a 25 y.o. G1P0000 Not Hispanic or Latino female, LMP uncertain (7/61/9509?) presents with amenorrhea and positive home pregnancy test. Based on her  LMP, her EDD is Estimated Date of Delivery: 08/30/21 and her EGA is [redacted]w[redacted]d. Cycles are 5. days, regular, and occur approximately every : 28 days. Her last pap smear was a few monthsago and was no abnormalities.    She had a urine pregnancy test which was positive about 1 week(s)  ago. Her last menstrual period was normal and lasted for  4 or 5 day(s). Since her LMP she claims she has experienced no symptoms of N/V or fatigue. She denies vaginal bleeding. Her past medical history is noncontributory. Her prior pregnancies are notable for none  Since her LMP, she admits to the use of tobacco products  no She claims she has gained   no pounds since the start of her pregnancy.  There are cats in the home in the home  no She admits close contact with children on a regular basis  yes  She has had chicken pox in the past no She has had Tuberculosis exposures, symptoms, or previously tested positive for TB   no Current or past history of domestic violence. no  Genetic Screening/Teratology Counseling: (Includes patient, baby's father, or anyone in either family with:)   82. Patient's age >/= 54 [facility]  no 2. Thalassemia ([place], [place], [place], or Asian background): MCV<80  no 3. Neural tube defect (meningomyelocele, spina bifida, anencephaly)  no 4. Congenital heart defect  no  5. Down syndrome  no 6. Tay-Sachs (Jewish, [place])  no 7. Canavan's Disease  no 8. Sickle cell disease or trait (African)  no  9. Hemophilia or other blood disorders  no  10. Muscular dystrophy  no  11. Cystic fibrosis  no  12. Huntington's Chorea  no  13. Mental retardation/autism  no 14. Other inherited genetic or chromosomal disorder   no 15. Maternal metabolic disorder (DM, PKU, etc)  no 16. Patient or FOB with a child with a birth defect not listed above no  16a. Patient or FOB with a birth defect themselves no 17. Recurrent pregnancy loss, or stillbirth  no  18. Any medications since LMP other than prenatal vitamins (include vitamins, supplements, OTC meds, drugs, alcohol)  no 19. Any other genetic/environmental exposure to discuss  no  Infection History:   1. Lives with someone with TB or TB exposed  no  2. Patient or partner has history of genital herpes  yes 3. Rash or viral illness since LMP  no 4. History of STI (GC, CT, HPV, syphilis, HIV)  yes 5. History of recent travel :  no  Other pertinent information:  no     Review of Systems:10 point review of systems negative unless otherwise noted in HPI  Past Medical History:  Past Medical History:  Diagnosis Date  . Asthma    daily inhaler  . Family history of breast cancer   . Increased risk of breast cancer   . 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 Donne Hazel  . PONV (postoperative nausea and vomiting)     Past Surgical History:  Past Surgical History:  Procedure Laterality Date  . MASS EXCISION Right 10/14/2013   Procedure: RIGHT BREAST MASS EXCISION;  Surgeon: Rolm Bookbinder, MD;  Location: [place];  Service: General;  Laterality: Right;  . RADIOACTIVE SEED GUIDED EXCISIONAL BREAST BIOPSY Left 07/16/2019   Procedure: RADIOACTIVE SEED GUIDED EXCISIONAL LEFT BREAST BIOPSY;  Surgeon: Rolm Bookbinder, MD;  Location: [place];  Service: General;  Laterality: Left;  . WISDOM TOOTH EXTRACTION      Gynecologic History: Patient's last menstrual period was 11/23/2020 (approximate).  Obstetric History: G1P0000  Family History:  Family History  Problem Relation Age of Onset  . Breast cancer Mother 67       myRisk neg  . Multiple sclerosis Mother   . Diabetes Maternal Grandfather   . Hypertension  Maternal Grandfather   . Heart disease Maternal Grandfather   . Diabetes Maternal Grandmother   . Multiple sclerosis Maternal Uncle   . Healthy Brother   . Healthy Brother   . Breast cancer Other 53    Social History:  Social History   Socioeconomic History  . Marital status: Single    Spouse name: Not on file  . Number of children: Not on file  . Years of education: Not on file  . Highest education level: Not on file  Occupational History  . Occupation: Ship broker    Comment: Planning to go to [employer] in fall for [employer].   . Occupation: works at [employer]  . Smoking status: Never  . Smokeless tobacco: Never  Vaping Use  . Vaping Use: Never used  Substance and Sexual Activity  . Alcohol use: Not Currently    Comment: socially  . Drug use: No  . Sexual activity: Yes    Birth control/protection: None  Other Topics Concern  . Not on file  Social History Narrative  . Not on file   Social Determinants of Health   Financial Resource Strain: Not on file  Food Insecurity: Not on file  Transportation Needs: Not on file  Physical Activity: Not on file  Stress: Not on file  Social Connections: Not on file  Intimate Partner Violence: Not on file    Allergies:  No Known Allergies  Medications: Prior to Admission medications   Medication Sig Start Date End Date Taking? Authorizing Provider  albuterol (VENTOLIN HFA) 108 (90 Base) MCG/ACT inhaler Inhale 2 puffs into the lungs every 6 (six) hours as needed for wheezing or shortness of breath. 05/18/20  Yes Trinna Post, PA-C  fexofenadine (ALLEGRA) 180 MG tablet Take 180 mg by mouth daily.   Yes [provider]  montelukast (SINGULAIR) 10 MG tablet TAKE 1 TABLET(10 MG) BY MOUTH AT BEDTIME 06/08/20  Yes Pollak, Adriana M, PA-C  sertraline (ZOLOFT) 50 MG tablet Take 1 tablet (50 mg total) by mouth daily. 05/18/20  Yes Carles Collet M, PA-C  valACYclovir (VALTREX) 1000 MG tablet Take 1g once daily  for five days at first sign of outbreak. 12/07/20  Yes Jerrol Banana., MD  Grant Ruts INHUB 250-50 MCG/DOSE AEPB INHALE 1 PUFF INTO THE LUNGS TWICE DAILY 10/09/20  Yes Trinna Post, PA-C  ethynodiol-ethinyl estradiol (ZOVIA) 1-35 MG-MCG tablet Take 1 tablet by mouth daily. 05/18/20 10/31/20  Trinna Post, PA-C    Physical Exam Vitals: Blood pressure 100/60, weight 154 lb (69.9 kg), last menstrual period 11/23/2020.  General: NAD HEENT: normocephalic, anicteric Thyroid: no enlargement, no palpable nodules Pulmonary: No increased work of breathing, CTAB Cardiovascular: RRR, distal pulses 2+ Abdomen: NABS, soft, non-tender, non-distended.  Umbilicus without lesions.  No hepatomegaly, splenomegaly or masses palpable. No evidence of hernia  Genitourinary:  External:  Normal external female genitalia.  Normal urethral meatus, normal  Bartholin's and Skene's glands.    Vagina: Normal vaginal mucosa, no evidence of prolapse.    Cervix: Grossly normal in appearance, no bleeding  Uterus: anteverted, Non-enlarged, mobile, normal contour.  No CMT  Adnexa: ovaries non-enlarged, no adnexal masses  Rectal: deferred Extremities: no edema, erythema, or tenderness Neurologic: Grossly intact Psychiatric: mood appropriate, affect full   Assessment: 25 y.o. G1P0000 at [redacted]w[redacted]d presenting to initiate prenatal care  Plan: 1) Avoid alcoholic beverages. 2) Patient encouraged not to smoke.  3) Discontinue the use of all non-medicinal drugs and chemicals.  4) Take prenatal vitamins daily.  5) Nutrition, food safety (fish, cheese advisories, and high nitrite foods) and exercise discussed. 6) Hospital and practice style discussed with cross coverage system.  7) Genetic Screening, such as with 1st Trimester Screening, cell free fetal DNA, AFP testing, and Ultrasound, as well as with amniocentesis and CVS as appropriate, is discussed with patient. At the conclusion of today's visit patient undecided genetic  testing 8) Patient is asked about travel to areas at risk for the [place] virus, and counseled to avoid travel and exposure to mosquitoes or sexual partners who may have themselves been exposed to the virus. Testing is discussed, and will be ordered as appropriate.   The following were addressed during this visit:  Breastfeeding Education - Early initiation of breastfeeding    Comments: Keeps milk supply adequate, helps contract uterus and slow bleeding, and early milk is the perfect first food and is easy to digest.   - The importance of exclusive breastfeeding    Comments: Provides antibodies, Lower risk of breast and ovarian cancers, and type-2 diabetes,Helps your body recover, Reduced chance of SIDS.   - Frequent feeding to help assure optimal milk production    Comments: Making a full supply of milk requires frequent removal of milk from breasts, infant will eat 8-12 times in 24 hours, if separated from infant use breast massage, hand expression and/ or pumping to remove milk from breasts.   - Exclusive breastfeeding for the first 6 months    Comments: Builds a healthy milk supply and keeps it up, protects baby from sickness and disease, and breastmilk has everything your baby needs for the first 6 months.    Imagene Riches, CNM  02/07/2021 5:30 PM

## 2021-02-07 NOTE — Progress Notes (Signed)
NOB- no concern

## 2021-02-08 LAB — CYTOLOGY - PAP
Chlamydia: NEGATIVE
Comment: NEGATIVE
Comment: NEGATIVE
Comment: NORMAL
Diagnosis: NEGATIVE
Neisseria Gonorrhea: NEGATIVE
Trichomonas: NEGATIVE

## 2021-02-09 LAB — CULTURE, OB URINE

## 2021-02-09 LAB — URINE CULTURE, OB REFLEX: Organism ID, Bacteria: NO GROWTH

## 2021-02-23 ENCOUNTER — Encounter: Payer: Self-pay | Admitting: Obstetrics and Gynecology

## 2021-03-03 ENCOUNTER — Encounter: Payer: Self-pay | Admitting: Obstetrics & Gynecology

## 2021-03-03 ENCOUNTER — Other Ambulatory Visit: Payer: Self-pay

## 2021-03-03 ENCOUNTER — Ambulatory Visit (INDEPENDENT_AMBULATORY_CARE_PROVIDER_SITE_OTHER): Payer: BC Managed Care – PPO | Admitting: Obstetrics & Gynecology

## 2021-03-03 VITALS — BP 100/60 | Wt 151.0 lb

## 2021-03-03 DIAGNOSIS — N926 Irregular menstruation, unspecified: Secondary | ICD-10-CM | POA: Diagnosis not present

## 2021-03-03 DIAGNOSIS — Z3A01 Less than 8 weeks gestation of pregnancy: Secondary | ICD-10-CM

## 2021-03-03 DIAGNOSIS — Z3482 Encounter for supervision of other normal pregnancy, second trimester: Secondary | ICD-10-CM

## 2021-03-03 DIAGNOSIS — Z3689 Encounter for other specified antenatal screening: Secondary | ICD-10-CM

## 2021-03-03 NOTE — Patient Instructions (Addendum)
Thank you for choosing [facility]. As part of our ongoing efforts to improve patient experience, we would appreciate your feedback. Please fill out the short survey that you will receive by mail or MyChart. Your opinion is important to [place]! -Dr Kenton Kingfisher  First Trimester of Pregnancy  The first trimester of pregnancy starts on the first day of your last menstrual period until the end of week 12. This is months 1 through 3 of pregnancy. A week after a sperm fertilizes an egg, the egg will implant into the wall of the uterus and begin to develop into a baby. By the end of 12 weeks, all the baby'sorgans will be formed and the baby will be 2-3 inches in size. Body changes during your first trimester Your body goes through many changes during pregnancy. The changes vary andgenerally return to normal after your baby is born. Physical changes You may gain or lose weight. Your breasts may begin to grow larger and become tender. The tissue that surrounds your nipples (areola) may become darker. Dark spots or blotches (chloasma or mask of pregnancy) may develop on your face. You may have changes in your hair. These can include thickening or thinning of your hair or changes in texture. Health changes You may feel nauseous, and you may vomit. You may have heartburn. You may develop headaches. You may develop constipation. Your gums may bleed and may be sensitive to brushing and flossing. Other changes You may tire easily. You may urinate more often. Your menstrual periods will stop. You may have a loss of appetite. You may develop cravings for certain kinds of food. You may have changes in your emotions from day to day. You may have more vivid and strange dreams. Follow these instructions at home: Medicines Follow your health care provider's instructions regarding medicine use. Specific medicines may be either safe or unsafe to take during pregnancy. Do not take any medicines unless told to by your  health care provider. Take a prenatal vitamin that contains at least 600 micrograms (mcg) of folic acid. Eating and drinking Eat a healthy diet that includes fresh fruits and vegetables, whole grains, good sources of protein such as meat, eggs, or tofu, and low-fat dairy products. Avoid raw meat and unpasteurized juice, milk, and cheese. These carry germs that can harm you and your baby. If you feel nauseous or you vomit: Eat 4 or 5 small meals a day instead of 3 large meals. Try eating a few soda crackers. Drink liquids between meals instead of during meals. You may need to take these actions to prevent or treat constipation: Drink enough fluid to keep your urine pale yellow. Eat foods that are high in fiber, such as beans, whole grains, and fresh fruits and vegetables. Limit foods that are high in fat and processed sugars, such as fried or sweet foods. Activity Exercise only as directed by your health care provider. Most people can continue their usual exercise routine during pregnancy. Try to exercise for 30 minutes at least 5 days a week. Stop exercising if you develop pain or cramping in the lower abdomen or lower back. Avoid exercising if it is very hot or humid or if you are at high altitude. Avoid heavy lifting. If you choose to, you may have sex unless your health care provider tells you not to. Relieving pain and discomfort Wear a good support bra to relieve breast tenderness. Rest with your legs elevated if you have leg cramps or low back pain. If you  develop bulging veins (varicose veins) in your legs: Wear support hose as told by your health care provider. Elevate your feet for 15 minutes, 3-4 times a day. Limit salt in your diet. Safety Wear your seat belt at all times when driving or riding in a car. Talk with your health care provider if someone is verbally or physically abusive to you. Talk with your health care provider if you are feeling sad or have thoughts of hurting  yourself. Lifestyle Do not use hot tubs, steam rooms, or saunas. Do not douche. Do not use tampons or scented sanitary pads. Do not use herbal remedies, alcohol, illegal drugs, or medicines that are not approved by your health care provider. Chemicals in these products can harm your baby. Do not use any products that contain nicotine or tobacco, such as cigarettes, e-cigarettes, and chewing tobacco. If you need help quitting, ask your health care provider. Avoid cat litter boxes and soil used by cats. These carry germs that can cause birth defects in the baby and possibly loss of the unborn baby (fetus) by miscarriage or stillbirth. General instructions During routine prenatal visits in the first trimester, your health care provider will do a physical exam, perform necessary tests, and ask you how things are going. Keep all follow-up visits. This is important. Ask for help if you have counseling or nutritional needs during pregnancy. Your health care provider can offer advice or refer you to specialists for help with various needs. Schedule a dentist appointment. At home, brush your teeth with a soft toothbrush. Floss gently. Write down your questions. Take them to your prenatal visits. Where to find more information American Pregnancy Association: americanpregnancy.[place] and Gynecologists: PoolDevices.com.pt Office on [employer] Health: KeywordPortfolios.com.br Contact a health care provider if you have: Dizziness. A fever. Mild pelvic cramps, pelvic pressure, or nagging pain in the abdominal area. Nausea, vomiting, or diarrhea that lasts for 24 hours or longer. A bad-smelling vaginal discharge. Pain when you urinate. Known exposure to a contagious illness, such as chickenpox, measles, Zika virus, HIV, or hepatitis. Get help right away if you have: Spotting or bleeding from your vagina. Severe abdominal cramping or pain. Shortness of  breath or chest pain. Any kind of trauma, such as from a fall or a car crash. New or increased pain, swelling, or redness in an arm or leg. Summary The first trimester of pregnancy starts on the first day of your last menstrual period until the end of week 12 (months 1 through 3). Eating 4 or 5 small meals a day rather than 3 large meals may help to relieve nausea and vomiting. Do not use any products that contain nicotine or tobacco, such as cigarettes, e-cigarettes, and chewing tobacco. If you need help quitting, ask your health care provider. Keep all follow-up visits. This is important. This information is not intended to replace advice given to you by your health care provider. Make sure you discuss any questions you have with your healthcare provider. Document Revised: 01/20/2020 Document Reviewed: 11/26/2019 Elsevier Patient Education  2022 [employer].

## 2021-03-03 NOTE — Progress Notes (Signed)
ULTRASOUND REPORT  Location: [facility] Date of Service: 03/03/2021   Indications: Irreg periods Findings:  Singleton intrauterine pregnancy is visualized with a CRL consistent with [redacted]w[redacted]d gestation, giving an (U/S) EDD of 10/25/21. The (U/S) EDD is not consistent with the clinically established EDD of 09/01/21.  FHR: not present CRL measurement: 5.4 mm Yolk sac is visualized and appears normal. Amnion: not visualized   Right Ovary is normal in appearance. Left Ovary is normal appearance. Corpus luteal cyst:  is not visualized Uterus, question of fibroid 1 cm submucosal Survey of the adnexa demonstrates no adnexal masses. There is no free peritoneal fluid in the cul de sac.  Impression: 1. [redacted]w[redacted]d Viable Singleton Intrauterine pregnancy by U/S. 2. (U/S) EDD is consistent with Clinically established EDD of 10/25/21.  Recommendations: 1.Clinical correlation with the patient's History and Physical Exam. 2. Change EDC 3. Repeat US one week to assess for FHTs 4. Follow up on fibroid/uterine anatomy   Hoyt Koch, MD

## 2021-03-03 NOTE — Progress Notes (Signed)
Prenatal Visit Note Date: 03/03/2021 Clinic: [facility]  Subjective:  Shelley Walton is a 25 y.o. G1P0000 at 14 weeks by dates but then [redacted]w[redacted]d by todays [place]; being seen today for ongoing prenatal care.   Patient reports rare nausea and no pain, occas spotting (last episode last week).   Objective:   Vitals:   03/03/21 0814  BP: 100/60  Weight: 151 lb (68.5 kg)   Fetal Status:     Movement: Absent     General:  Alert, oriented and cooperative. Patient is in no acute distress.  Skin: Skin is warm and dry. No rash noted.   Cardiovascular: Normal heart rate noted  Respiratory: Normal respiratory effort, no problems with respiration noted  Abdomen: Soft, gravid, appropriate for gestational age. Pain/Pressure: Present     Pelvic:  Cervical exam deferred        Extremities: Normal range of motion.     Mental Status: Normal mood and affect. Normal behavior. Normal judgment and thought content.     Assessment and Plan:  Pregnancy: G1P0000 at [redacted]w[redacted]d by todays [place]  1. Screening, antenatal, for fetal anatomic survey - at 29 weelks - US OB Comp + 14 Wk; Future  2. Encounter for supervision of other normal pregnancy in second trimester PNV  3. Missed period - Due to no FHT on [place] today, will follow w labs and repeat US - Beta hCG quant (ref lab) - Beta hCG quant (ref lab); Future  4. [redacted] weeks gestation of pregnancy  5. Irregular menses  Return in about 3 days (around 03/06/2021) for Lab Appt Monday; also ROB and early OOB [place] w MD 7-10 days.  Barnett Applebaum, MD, [facility], [facility] 03/03/2021  8:58 AM

## 2021-03-04 LAB — BETA HCG QUANT (REF LAB): hCG Quant: 60239 m[IU]/mL

## 2021-03-06 ENCOUNTER — Other Ambulatory Visit: Payer: BC Managed Care – PPO

## 2021-03-06 ENCOUNTER — Other Ambulatory Visit: Payer: Self-pay

## 2021-03-06 DIAGNOSIS — N926 Irregular menstruation, unspecified: Secondary | ICD-10-CM

## 2021-03-07 LAB — BETA HCG QUANT (REF LAB): hCG Quant: 57232 m[IU]/mL

## 2021-03-08 ENCOUNTER — Other Ambulatory Visit: Payer: Self-pay | Admitting: Advanced Practice Midwife

## 2021-03-08 ENCOUNTER — Other Ambulatory Visit: Payer: Self-pay

## 2021-03-08 ENCOUNTER — Other Ambulatory Visit: Payer: BC Managed Care – PPO

## 2021-03-08 DIAGNOSIS — O3680X0 Pregnancy with inconclusive fetal viability, not applicable or unspecified: Secondary | ICD-10-CM

## 2021-03-08 NOTE — Progress Notes (Signed)
Order placed for beta hcg lab-only visit. Message to patient regarding decreasing beta numbers.

## 2021-03-08 NOTE — Telephone Encounter (Signed)
Can you advise sine RPH is out of the office.

## 2021-03-09 LAB — BETA HCG QUANT (REF LAB): hCG Quant: 45720 m[IU]/mL

## 2021-03-13 ENCOUNTER — Ambulatory Visit (INDEPENDENT_AMBULATORY_CARE_PROVIDER_SITE_OTHER): Payer: BC Managed Care – PPO | Admitting: Obstetrics and Gynecology

## 2021-03-13 ENCOUNTER — Other Ambulatory Visit: Payer: Self-pay | Admitting: Obstetrics & Gynecology

## 2021-03-13 ENCOUNTER — Other Ambulatory Visit: Payer: Self-pay

## 2021-03-13 VITALS — BP 108/72 | Wt 153.0 lb

## 2021-03-13 DIAGNOSIS — Z3481 Encounter for supervision of other normal pregnancy, first trimester: Secondary | ICD-10-CM | POA: Diagnosis not present

## 2021-03-13 DIAGNOSIS — Z3A01 Less than 8 weeks gestation of pregnancy: Secondary | ICD-10-CM

## 2021-03-13 DIAGNOSIS — Z3689 Encounter for other specified antenatal screening: Secondary | ICD-10-CM

## 2021-03-13 DIAGNOSIS — Z3480 Encounter for supervision of other normal pregnancy, unspecified trimester: Secondary | ICD-10-CM | POA: Diagnosis not present

## 2021-03-14 NOTE — Progress Notes (Signed)
ULTRASOUND REPORT  Location: [facility] Date of Service: 03/13/2021   Indications:Threatened AB (transvaginal) Findings:  Singleton intrauterine pregnancy is visualized with a CRL consistent with [redacted]w[redacted]d gestation, giving an (U/S) EDD of 11/04/2021. The (U/S) EDD is not consistent with a previous EDD of 10/25/2021 established by ultrasound.  FHR: absent CRL measurement:  consistent with [redacted]w[redacted]d Yolk sac is visualized and appears normal. Amnion: visualized and appears normal  The gestational sac has an irregular contour and there is an area that appears to be be adjacent to the gestational sac. This area is small.  Another area to her right and anterior also appears to be a small subchorionic hemorrhage.    Right Ovary is normal in appearance. Left Ovary is normal appearance. Corpus luteal cyst:  is not visualized Survey of the adnexa demonstrates no adnexal masses. There is no free peritoneal fluid in the cul de sac.  Impression: 1. [redacted]w[redacted]d Singleton Intrauterine pregnancy by U/S without cardiac activity after an ultrasound 10 days ago showing a fetal pole with yolk sac. 2. While these findings are highly suggestive of early pregnancy loss, they do not specifically meet criteria for early pregnancy loss.  There does not appear to be any significant change in the size of the fetal pole, which also supports the concern for early pregnancy loss.  Female chaperone present for pelvic, transvaginal ultrasound:   Recommendations: Follow up ultrasound in 11 or more days to verify ultrasound findings from today.  I personally performed the ultrasound and interpreted the images.  Prentice Docker, MD, [facility], [facility] 03/13/2021 2:45PM

## 2021-03-14 NOTE — Progress Notes (Signed)
Routine Prenatal Care Visit  Subjective  Shelley Walton is a 25 y.o. G1P0000 at [redacted]w[redacted]d being seen today for ongoing prenatal care.  She is currently monitored for the following issues for this low-risk pregnancy and has Acne; Asthma; History of chlamydia; Contraception, generic surveillance; Genital herpes simplex type 1 infection; Family history of breast cancer; Increased risk of breast cancer; and Supervision of other normal pregnancy, antepartum on their problem list.  ----------------------------------------------------------------------------------- Patient reports no complaints.    .  .   Shelley Walton Fluid denies.  U/S today shows abnormally shaped intrauterine gestational sac.  ?fetal pole with CRL consistent with [redacted]w[redacted]d gestation. No cardiac activity noted.  [facility] noted, as well.  Yolk sac was noted. ----------------------------------------------------------------------------------- The following portions of the patient's history were reviewed and updated as appropriate: allergies, current medications, past family history, past medical history, past social history, past surgical history and problem list. Problem list updated.  Objective  Blood pressure 108/72, weight 153 lb (69.4 kg), last menstrual period 11/23/2020. Pregravid weight 160 lb (72.6 kg) Total Weight Gain -7 lb (-3.175 kg) Urinalysis: Urine Protein    Urine Glucose    Fetal Status:           General:  Alert, oriented and cooperative. Patient is in no acute distress.  Skin: Skin is warm and dry. No rash noted.   Cardiovascular: Normal heart rate noted  Respiratory: Normal respiratory effort, no problems with respiration noted  Abdomen: Soft, gravid, appropriate for gestational age.       Pelvic:  Cervical exam deferred        Extremities: Normal range of motion.     Mental Status: Normal mood and affect. Normal behavior. Normal judgment and thought content.   Assessment   25 y.o. G1P0000 at [redacted]w[redacted]d by  10/25/2021, by  Ultrasound presenting for routine prenatal visit  Plan   FIRST Problems (from 02/07/21 to present)     Problem Noted Resolved   Supervision of other normal pregnancy, antepartum 02/07/2021 by Imagene Riches, CNM No   Overview Signed 02/07/2021  5:31 PM by Imagene Riches, CNM     Clinic  Prenatal Labs  Dating  Blood type:     Genetic Screen 1 Screen:    AFP:     Quad:     NIPS: Antibody:   Anatomic [place]  Rubella:    GTT Early:               Third trimester:  RPR:     Flu vaccine  HBsAg:     TDaP vaccine                                               Rhogam: HIV:     Baby Food                                               GBS: (For PCN allergy, check sensitivities)  Contraception  Pap:  Circumcision    Pediatrician    Support Person                   Preterm labor symptoms and general obstetric precautions including but not limited to vaginal bleeding, contractions, leaking of fluid and  fetal movement were reviewed in detail with the patient. Please refer to After Visit Summary for other counseling recommendations.   Discussed u/s findings along with decreasing hCG levels are strongly indicative of early pregnancy loss. We discussed that while the diagnosis is not certain, the findings so far are suggestive.  We discussed potential treatment options. However, prior to any definitive treatment, I would recommend a follow up ultrasound in no less than 11 days to be sure.  The patient and her fiance voiced that they came to the appointment prepared for this eventuality.  We discussed management of a pregnancy loss and they voiced a desire to have a repeat ultrasound to be absolutely certain.  Will see her back in about 4 weeks for ultrasound or sooner, if needed. All questions answered.   Return in about 4 weeks (around 04/10/2021) for Pelvic U/S and follow up MD only.   Prentice Docker, MD, [facility], [facility] 03/14/2021 7:56 AM

## 2021-04-10 ENCOUNTER — Other Ambulatory Visit: Payer: Self-pay

## 2021-04-10 ENCOUNTER — Ambulatory Visit (INDEPENDENT_AMBULATORY_CARE_PROVIDER_SITE_OTHER): Payer: BC Managed Care – PPO | Admitting: Obstetrics & Gynecology

## 2021-04-10 VITALS — BP 100/70 | Wt 157.0 lb

## 2021-04-10 DIAGNOSIS — R300 Dysuria: Secondary | ICD-10-CM

## 2021-04-10 DIAGNOSIS — O209 Hemorrhage in early pregnancy, unspecified: Secondary | ICD-10-CM | POA: Diagnosis not present

## 2021-04-10 LAB — POCT URINALYSIS DIPSTICK
Bilirubin, UA: NEGATIVE
Blood, UA: POSITIVE
Glucose, UA: NEGATIVE
Ketones, UA: NEGATIVE
Nitrite, UA: NEGATIVE
Protein, UA: NEGATIVE
Spec Grav, UA: 1.01 (ref 1.010–1.025)
Urobilinogen, UA: 0.2 E.U./dL
pH, UA: 5 (ref 5.0–8.0)

## 2021-04-10 MED ORDER — SULFAMETHOXAZOLE-TRIMETHOPRIM 800-160 MG PO TABS: 1.0000 | ORAL_TABLET | Freq: Two times a day (BID) | ORAL | 0 refills | Status: AC

## 2021-04-10 NOTE — Progress Notes (Signed)
HPI:      Shelley Walton is a 25 y.o. G1P0010 who LMP was Patient's last menstrual period was 11/23/2020 (approximate)., presents today for a problem visit.    Miscarriage: Pt had episode of vaginal bleeding after last [place] c/w miscarriage (2 ultrasounds in July had suggestion of miscarriage).  She had elevated hCG in th 45000 range (decreasing from as high as 60,000).  No pain. No recent bleeding.  No nausea.  Urinary Tract Infection: Patient complains of dysuria . She has had symptoms for 2 days. Patient also complains of  dark urine . Patient denies fever, headache, and vaginal discharge. Patient does not have a history of recurrent UTI.  Patient does not have a history of pyelonephritis.   PMHx: She  has a past medical history of Asthma, Family history of breast cancer, Increased risk of breast cancer, Mass of breast, right (09/2013), and PONV (postoperative nausea and vomiting). Also,  has a past surgical history that includes Mass excision (Right, 10/14/2013); Wisdom tooth extraction; and Radioactive seed guided excisional breast biopsy (Left, 07/16/2019)., family history includes Breast cancer (age of onset: 15) in her mother; Breast cancer (age of onset: 82) in an other family member; Diabetes in her maternal grandfather and maternal grandmother; Healthy in her brother and brother; Heart disease in her maternal grandfather; Hypertension in her maternal grandfather; Multiple sclerosis in her maternal uncle and mother.,  reports that she has never smoked. She has never used smokeless tobacco. She reports that she does not currently use alcohol. She reports that she does not use drugs.  She has a current medication list which includes the following prescription(s): sulfamethoxazole-trimethoprim, albuterol, fexofenadine, montelukast, sertraline, valacyclovir, wixela inhub, and [DISCONTINUED] ethynodiol-ethinyl estradiol. Also, has No Known Allergies.  Review of Systems  Constitutional:   Negative for chills, fever and malaise/fatigue.  HENT:  Negative for congestion, sinus pain and sore throat.   Eyes:  Negative for blurred vision and pain.  Respiratory:  Negative for cough and wheezing.   Cardiovascular:  Negative for chest pain and leg swelling.  Gastrointestinal:  Negative for abdominal pain, constipation, diarrhea, heartburn, nausea and vomiting.  Genitourinary:  Negative for dysuria, frequency, hematuria and urgency.  Musculoskeletal:  Negative for back pain, joint pain, myalgias and neck pain.  Skin:  Negative for itching and rash.  Neurological:  Negative for dizziness, tremors and weakness.  Endo/Heme/Allergies:  Does not bruise/bleed easily.  Psychiatric/Behavioral:  Negative for depression. The patient is not nervous/anxious and does not have insomnia.    Objective: BP 100/70   Wt 157 lb (71.2 kg)   LMP 11/23/2020 (Approximate)   BMI 26.95 kg/m  Physical Exam Constitutional:      General: She is not in acute distress.    Appearance: She is well-developed.  Musculoskeletal:        General: Normal range of motion.  Neurological:     Mental Status: She is alert and oriented to person, place, and time.  Skin:    General: Skin is warm and dry.  Vitals reviewed.    ASSESSMENT/PLAN:   Acute cystitis    Plan: Bactrim Dysuria    Relevant Orders  POCT urinalysis dipstick (Completed)  First trimester bleeding      Relevant Orders  Beta hCG quant (ref lab)  Plan to monitor beta hCG and even [place] if necessary to see as to resolution of pregnancy and miscarriage Pt plans marriage in Sept, try for pregnancy afterwards sometime     Barnett Applebaum, MD,  [facility], [facility] 04/10/2021  2:20 PM

## 2021-04-11 LAB — BETA HCG QUANT (REF LAB): hCG Quant: 26 m[IU]/mL

## 2021-05-26 ENCOUNTER — Ambulatory Visit
Admission: EM | Admit: 2021-05-26 | Discharge: 2021-05-26 | Disposition: A | Discharge status: Home / Self Care | Payer: BC Managed Care – PPO | Attending: Physician Assistant | Admitting: Physician Assistant

## 2021-05-26 ENCOUNTER — Ambulatory Visit (INDEPENDENT_AMBULATORY_CARE_PROVIDER_SITE_OTHER): Payer: BC Managed Care – PPO

## 2021-05-26 ENCOUNTER — Other Ambulatory Visit: Payer: Self-pay

## 2021-05-26 ENCOUNTER — Encounter: Payer: Self-pay | Admitting: Emergency Medicine

## 2021-05-26 DIAGNOSIS — R059 Cough, unspecified: Secondary | ICD-10-CM

## 2021-05-26 DIAGNOSIS — J45909 Unspecified asthma, uncomplicated: Secondary | ICD-10-CM | POA: Diagnosis not present

## 2021-05-26 DIAGNOSIS — J069 Acute upper respiratory infection, unspecified: Secondary | ICD-10-CM | POA: Diagnosis not present

## 2021-05-26 DIAGNOSIS — R058 Other specified cough: Secondary | ICD-10-CM

## 2021-05-26 DIAGNOSIS — J45901 Unspecified asthma with (acute) exacerbation: Secondary | ICD-10-CM | POA: Diagnosis not present

## 2021-05-26 MED ORDER — AZITHROMYCIN 250 MG PO TABS: ORAL_TABLET | ORAL | 0 refills | Status: DC

## 2021-05-26 MED ORDER — FLUCONAZOLE 200 MG PO TABS: 200.0000 mg | ORAL_TABLET | Freq: Every day | ORAL | 1 refills | Status: DC

## 2021-05-26 MED ORDER — FLUTICASONE-SALMETEROL 250-50 MCG/ACT IN AEPB: 1.0000 | INHALATION_SPRAY | Freq: Two times a day (BID) | RESPIRATORY_TRACT | 1 refills | Status: DC

## 2021-05-26 NOTE — Discharge Instructions (Addendum)
-  Azithromycin: Follow instructions on packaging -Fluticasone/salmeterol refilled -Over-the-counter medications for symptom management -Nasal saline rinse or Nettie pot like rinse to help clear out the sinuses and congestion. -Follow-up with primary care as needed.

## 2021-05-26 NOTE — ED Provider Notes (Signed)
[facility]    CSN: [id number] Arrival date & time: 05/26/21  1859      History   Chief Complaint Chief Complaint  Patient presents with   Cough   Nasal Congestion    HPI Shelley Walton is a 25 y.o. female.   Patient is a 25 year old female with past medical history of asthma who presents with chief complaint of upper respiratory symptoms as well as productive cough.  Patient states her symptoms initially started with sore throat and congestion around September 18.  She took a COVID test on the 21st that was negative.  Patient ports her symptoms have worsened over the last couple days.  She denies any fever but does report cough, sneezing, congestion, and ear fullness.  She reports some shortness of breath when she is laying down as well as some bilateral pain in the rib area that started a couple days ago.  Patient also states that the last couple days her cough is worsened is now productive of green mucus.  Additionally, patient states she has a history of asthma and has been out of her fluticasone/salmeterol for about 3 days.  She states she has had trouble getting it filled due to her previous provider leaving.   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   Contraception, generic surveillance 06/20/2015   History of chlamydia 06/09/2015   Acne 02/24/2015   Asthma 02/24/2015    Past Surgical History:  Procedure Laterality Date   MASS EXCISION Right 10/14/2013    Procedure: RIGHT BREAST MASS EXCISION;  Surgeon: Rolm Bookbinder, MD;  Location: [facility];  Service: General;  Laterality: Right;   RADIOACTIVE SEED GUIDED EXCISIONAL BREAST BIOPSY Left 07/16/2019   Procedure: RADIOACTIVE SEED GUIDED EXCISIONAL LEFT BREAST BIOPSY;  Surgeon: Rolm Bookbinder, MD;  Location: [facility];  Service: General;  Laterality: Left;   WISDOM TOOTH EXTRACTION      OB History     Gravida  1   Para  0   Term  0   Preterm  0   AB  0   Living  0      SAB  0   IAB  0   Ectopic  0   Multiple  0   Live Births  0            Home Medications    Prior to Admission medications   Medication Sig Start Date End Date Taking? Authorizing Provider  albuterol (VENTOLIN HFA) 108 (90 Base) MCG/ACT inhaler Inhale 2 puffs into the lungs every 6 (six) hours as needed for wheezing or shortness of breath. 05/18/20  Yes Trinna Post, PA-C  azithromycin (ZITHROMAX Z-PAK) 250 MG tablet Take 2 tablets by mouth the first day, 1 tablet the second day, continue with 1 tablet/day until gone. 05/26/21  Yes Luvenia Redden, PA-C  fexofenadine (ALLEGRA) 180 MG tablet Take 180 mg by  mouth daily.   Yes [provider]  fluconazole (DIFLUCAN) 200 MG tablet Take 1 tablet (200 mg total) by mouth daily. TAKE ONE TABLET BY MOUTH. CAN REPEAT IN ONE WEEK IF NEEDED 05/26/21  Yes Luvenia Redden, PA-C  fluticasone-salmeterol (ADVAIR) 250-50 MCG/ACT AEPB Inhale 1 puff into the lungs in the morning and at bedtime. 05/26/21  Yes Luvenia Redden, PA-C  montelukast (SINGULAIR) 10 MG tablet TAKE 1 TABLET(10 MG) BY MOUTH AT BEDTIME 06/08/20  Yes Pollak, Adriana M, PA-C  valACYclovir (VALTREX) 1000 MG tablet Take 1g once daily for five days at first sign of outbreak. Patient not taking: Reported on 03/13/2021 12/07/20   Jerrol Banana., MD  Grant Ruts INHUB 250-50 MCG/DOSE AEPB INHALE 1 PUFF INTO THE LUNGS TWICE DAILY 10/09/20   Trinna Post,  PA-C  ethynodiol-ethinyl estradiol (ZOVIA) 1-35 MG-MCG tablet Take 1 tablet by mouth daily. 05/18/20 10/31/20  Trinna Post, PA-C    Family History Family History  Problem Relation Age of Onset   Breast cancer Mother 67       myRisk neg   Multiple sclerosis Mother    Diabetes Maternal Grandfather    Hypertension Maternal Grandfather    Heart disease Maternal Grandfather    Diabetes Maternal Grandmother    Multiple sclerosis Maternal Uncle    Healthy Brother    Healthy Brother    Breast cancer Other 57    Social History Social History   Tobacco Use   Smoking status: Never   Smokeless tobacco: Never  Vaping Use   Vaping Use: Never used  Substance Use Topics   Alcohol use: Not Currently    Comment: socially   Drug use: No     Allergies   Patient has no known allergies.   Review of Systems Review of Systems as noted above in HPI.  Other systems reviewed and found to be negative   Physical Exam Triage Vital Signs ED Triage Vitals  Enc Vitals Group     BP 05/26/21 0847 120/86     Pulse Rate 05/26/21 0847 81     Resp 05/26/21 0847 14     Temp 05/26/21 0847 98.3 F (36.8 C)     Temp Source 05/26/21 0847 Oral     SpO2 05/26/21 0847 99 %     Weight 05/26/21 0844 160 lb (72.6 kg)     Height 05/26/21 0844 5' 4" (1.626 m)     Head Circumference --      Peak Flow --      Pain Score 05/26/21 0844 0     Pain Loc --      Pain Edu? --      Excl. in [place]? --    No data found.  Updated Vital Signs BP 120/86 (BP Location: Left Arm)   Pulse 81   Temp 98.3 F (36.8 C) (Oral)   Resp 14   Ht 5' 4" (1.626 m)   Wt 160 lb (72.6 kg)   LMP 05/05/2021 (Approximate)   SpO2 99%   Breastfeeding No   BMI 27.46 kg/m    Physical Exam Constitutional:      Appearance: Normal appearance. She is normal weight. She is not ill-appearing.  HENT:     Head: Normocephalic and atraumatic.     Right Ear: Tympanic membrane and ear canal normal.     Left Ear: Tympanic membrane and  ear canal normal.     Nose: Congestion present.     Mouth/Throat:  Mouth: Mucous membranes are moist.     Pharynx: No oropharyngeal exudate or posterior oropharyngeal erythema.     Comments: Clear postnasal drainage. Eyes:     Extraocular Movements: Extraocular movements intact.     Pupils: Pupils are equal, round, and reactive to light.  Cardiovascular:     Rate and Rhythm: Normal rate and regular rhythm.     Pulses: Normal pulses.     Heart sounds: Normal heart sounds.  Pulmonary:     Effort: Pulmonary effort is normal. No respiratory distress.     Breath sounds: No wheezing or rhonchi.  Abdominal:     General: Abdomen is flat.     Palpations: Abdomen is soft.  Musculoskeletal:        General: Normal range of motion.     Cervical back: Normal range of motion and neck supple.  Lymphadenopathy:     Cervical: No cervical adenopathy.  Skin:    General: Skin is warm and dry.     Capillary Refill: Capillary refill takes less than 2 seconds.  Neurological:     General: No focal deficit present.     Mental Status: She is alert and oriented to person, place, and time.     UC Treatments / Results  Labs (all labs ordered are listed, but only abnormal results are displayed) Labs Reviewed - No data to display  EKG   Radiology DG Chest 2 View  Result Date: 05/26/2021 CLINICAL DATA:  Productive cough, asthma. Additional history provided: Patient reports cough, runny nose, nasal congestion. EXAM: CHEST - 2 VIEW COMPARISON:  Prior chest radiographs 05/27/2020. FINDINGS: Heart size within normal limits. No appreciable airspace consolidation. No evidence of pleural effusion or pneumothorax. No acute bony abnormality identified. IMPRESSION: No evidence of acute cardiopulmonary abnormality. Electronically Signed   By: Kellie Simmering D.O.   On: 05/26/2021 09:40    Procedures Procedures (including critical care time)  Medications Ordered in UC Medications - No data to display  Initial  Impression / Assessment and Plan / UC Course  I have reviewed the triage vital signs and the nursing notes.  Pertinent labs & imaging results that were available during my care of the patient were reviewed by me and considered in my medical decision making (see chart for details).    Patient with history of asthma and has been out of her inhaled steroid for 3 days.  Patient reports worsening cough with green mucus production as well as bilateral flank pain for the last couple days.  Symptoms began on the 18th with upper respiratory symptoms with congestion, ear fullness, sore throat.  Patient reports similar issue in the past that was found to be bilateral pneumonia.    Chest x-ray appears clear.  We will going give her refill of her inhaled steroid.  Also given her history going give her prescription for azithromycin.  Have her follow-up with primary care as needed.  Over-the-counter medications for symptom management.  Final Clinical Impressions(s) / UC Diagnoses   Final diagnoses:  Uncomplicated asthma, unspecified asthma severity, unspecified whether persistent  Upper respiratory tract infection, unspecified type  Productive cough     Discharge Instructions      -Azithromycin: Follow instructions on packaging -Fluticasone/salmeterol refilled -Over-the-counter medications for symptom management -Nasal saline rinse or Nettie pot like rinse to help clear out the sinuses and congestion. -Follow-up with primary care as needed.     ED Prescriptions     Medication Sig Dispense Auth. Provider   fluticasone-salmeterol (ADVAIR)  250-50 MCG/ACT AEPB Inhale 1 puff into the lungs in the morning and at bedtime. 1 each Luvenia Redden, PA-C   azithromycin (ZITHROMAX Z-PAK) 250 MG tablet Take 2 tablets by mouth the first day, 1 tablet the second day, continue with 1 tablet/day until gone. 6 tablet Luvenia Redden, PA-C   fluconazole (DIFLUCAN) 200 MG tablet Take 1 tablet (200 mg total) by  mouth daily. TAKE ONE TABLET BY MOUTH. CAN REPEAT IN ONE WEEK IF NEEDED 1 tablet Luvenia Redden, PA-C      PDMP not reviewed this encounter.   Luvenia Redden, PA-C 05/26/21 1007

## 2021-05-26 NOTE — ED Triage Notes (Signed)
Patient c/o cough, runny nose, and nasal congestion that started on 05/13/21.  Patient states that she had a COVID PCR test on 05/17/21 and was negative.  Patient denies fevers.

## 2021-06-09 ENCOUNTER — Ambulatory Visit: Payer: BC Managed Care – PPO

## 2021-06-12 ENCOUNTER — Encounter: Payer: BC Managed Care – PPO | Admitting: Obstetrics and Gynecology

## 2021-07-06 ENCOUNTER — Ambulatory Visit
Admission: EM | Admit: 2021-07-06 | Discharge: 2021-07-06 | Disposition: A | Discharge status: Home / Self Care | Payer: BC Managed Care – PPO | Attending: Emergency Medicine | Admitting: Emergency Medicine

## 2021-07-06 ENCOUNTER — Telehealth: Payer: Self-pay

## 2021-07-06 ENCOUNTER — Other Ambulatory Visit: Payer: Self-pay

## 2021-07-06 DIAGNOSIS — J069 Acute upper respiratory infection, unspecified: Secondary | ICD-10-CM

## 2021-07-06 MED ORDER — FLUCONAZOLE 200 MG PO TABS: 200.0000 mg | ORAL_TABLET | Freq: Every day | ORAL | 1 refills | Status: AC

## 2021-07-06 MED ORDER — AMOXICILLIN-POT CLAVULANATE 875-125 MG PO TABS: 1.0000 | ORAL_TABLET | Freq: Two times a day (BID) | ORAL | 0 refills | Status: AC

## 2021-07-06 MED ORDER — IPRATROPIUM BROMIDE 0.06 % NA SOLN: 2.0000 | Freq: Four times a day (QID) | NASAL | 12 refills | Status: DC

## 2021-07-06 NOTE — ED Provider Notes (Signed)
[facility]    CSN: [id number] Arrival date & time: 07/06/21  0907      History   Chief Complaint Chief Complaint  Patient presents with   Facial Pain    HPI Shelley Walton is a 25 y.o. female.   HPI  25 year old female here for evaluation of sinus complaints.  Patient ports that she has been experiencing maxillary sinus pressure, ear pain and pressure with ringing in her ears, runny nose and nasal congestion with yellow nasal discharge, headache, and body aches for the last 3 days.  She reports a subjective fever.  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  She denies sore throat, cough, or GI complaints.  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   Contraception, generic surveillance 06/20/2015   History of chlamydia 06/09/2015   Acne 02/24/2015   Asthma 02/24/2015    Past Surgical History:  Procedure Laterality Date   MASS EXCISION Right 10/14/2013   Procedure: RIGHT BREAST MASS EXCISION;  Surgeon: Rolm Bookbinder, MD;  Location: [facility];  Service: General;  Laterality: Right;   RADIOACTIVE SEED GUIDED EXCISIONAL BREAST BIOPSY Left 07/16/2019   Procedure: RADIOACTIVE SEED GUIDED EXCISIONAL LEFT BREAST BIOPSY;  Surgeon: Rolm Bookbinder, MD;  Location: [facility];  Service: General;  Laterality: Left;   WISDOM TOOTH EXTRACTION      OB History     Gravida  1   Para  0   Term  0   Preterm  0   AB   0   Living  0      SAB  0   IAB  0   Ectopic  0   Multiple  0   Live Births  0            Home Medications    Prior to Admission medications   Medication Sig Start Date End Date Taking? Authorizing Provider  amoxicillin-clavulanate (AUGMENTIN) 875-125 MG tablet Take 1 tablet by mouth every 12 (twelve) hours for 10 days. 07/06/21 07/16/21 Yes Margarette Canada, NP  fexofenadine (ALLEGRA) 180 MG tablet Take 180 mg by mouth daily.   Yes [provider]  fluconazole (DIFLUCAN) 200 MG tablet Take 1 tablet (200 mg total) by mouth daily for 2 doses. 07/06/21 07/08/21 Yes Margarette Canada, NP  ipratropium (ATROVENT) 0.06 % nasal spray Place 2 sprays into both nostrils 4 (four) times daily. 07/06/21  Yes Corin Tilly, Ysidro Evert, NP  Grant Ruts INHUB 250-50 MCG/DOSE AEPB INHALE 1 PUFF INTO THE LUNGS TWICE DAILY 10/09/20  Yes Carles Collet M, PA-C  valACYclovir (VALTREX) 1000 MG tablet Take 1g once daily for five days at first sign of outbreak. Patient not taking: No sig reported 12/07/20   Jerrol Banana., MD  ethynodiol-ethinyl estradiol (ZOVIA) 1-35 MG-MCG tablet Take 1 tablet by mouth daily. 05/18/20 10/31/20  Trinna Post, PA-C    Family History  Family History  Problem Relation Age of Onset   Breast cancer Mother 37       myRisk neg   Multiple sclerosis Mother    Diabetes Maternal Grandfather    Hypertension Maternal Grandfather    Heart disease Maternal Grandfather    Diabetes Maternal Grandmother    Multiple sclerosis Maternal Uncle    Healthy Brother    Healthy Brother    Breast cancer Other 51    Social History Social History   Tobacco Use   Smoking status: Never   Smokeless tobacco: Never  Vaping Use   Vaping Use: Never used  Substance Use Topics   Alcohol use: Not Currently    Comment: socially   Drug use: No     Allergies   Patient has no known allergies.   Review of Systems Review of Systems  Constitutional:  Positive for fever. Negative for  activity change and appetite change.  HENT:  Positive for congestion, ear pain, rhinorrhea, sinus pressure and sinus pain. Negative for sore throat.   Respiratory:  Negative for cough.   Gastrointestinal:  Negative for diarrhea, nausea and vomiting.  Musculoskeletal:  Positive for arthralgias and myalgias.  Skin:  Negative for rash.  Neurological:  Positive for headaches.  Hematological: Negative.     Physical Exam Triage Vital Signs ED Triage Vitals  Enc Vitals Group     BP 07/06/21 0951 108/68     Pulse Rate 07/06/21 0951 80     Resp 07/06/21 0951 16     Temp 07/06/21 0951 98.6 F (37 C)     Temp Source 07/06/21 0951 Oral     SpO2 07/06/21 0951 99 %     Weight 07/06/21 0950 160 lb (72.6 kg)     Height 07/06/21 0950 5' 4"  (1.626 m)     Head Circumference --      Peak Flow --      Pain Score 07/06/21 0949 0     Pain Loc --      Pain Edu? --      Excl. in [place]? --    No data found.  Updated Vital Signs BP 108/68 (BP Location: Left Arm)   Pulse 80   Temp 98.6 F (37 C) (Oral)   Resp 16   Ht 5' 4"  (1.626 m)   Wt 160 lb (72.6 kg)   LMP 06/29/2021 (Approximate)   SpO2 99%   BMI 27.46 kg/m   Visual Acuity Right Eye Distance:   Left Eye Distance:   Bilateral Distance:    Right Eye Near:   Left Eye Near:    Bilateral Near:     Physical Exam Vitals and nursing note reviewed.  Constitutional:      General: She is not in acute distress.    Appearance: Normal appearance. She is not ill-appearing.  HENT:     Head: Normocephalic and atraumatic.     Right Ear: Tympanic membrane, ear canal and external ear normal. There is no impacted cerumen.     Left Ear: Tympanic membrane, ear canal and external ear normal. There is no impacted cerumen.     Nose: Congestion and rhinorrhea present.     Mouth/Throat:     Mouth: Mucous membranes are moist.     Pharynx: Oropharynx is clear. Posterior oropharyngeal erythema present.  Cardiovascular:     Rate and Rhythm: Normal rate and  regular rhythm.     Pulses: Normal pulses.     Heart sounds: Normal heart sounds.  No murmur heard.   No gallop.  Pulmonary:     Effort: Pulmonary effort is normal.     Breath sounds: Normal breath sounds. No wheezing, rhonchi or rales.  Musculoskeletal:     Cervical back: Normal range of motion and neck supple.  Lymphadenopathy:     Cervical: No cervical adenopathy.  Skin:    General: Skin is warm and dry.     Capillary Refill: Capillary refill takes less than 2 seconds.     Findings: No erythema or rash.  Neurological:     General: No focal deficit present.     Mental Status: She is alert and oriented to person, place, and time.  Psychiatric:        Mood and Affect: Mood normal.        Behavior: Behavior normal.        Thought Content: Thought content normal.        Judgment: Judgment normal.     UC Treatments / Results  Labs (all labs ordered are listed, but only abnormal results are displayed) Labs Reviewed - No data to display  EKG   Radiology No results found.  Procedures Procedures (including critical care time)  Medications Ordered in UC Medications - No data to display  Initial Impression / Assessment and Plan / UC Course  I have reviewed the triage vital signs and the nursing notes.  Pertinent labs & imaging results that were available during my care of the patient were reviewed by me and considered in my medical decision making (see chart for details).  Patient is a nontoxic-appearing 25 year old female here for evaluation of sinus and upper respiratory complaints as outlined in HPI above.  Patient's physical exam reveals pearly gray tympanic membranes bilaterally with normal light reflex and clear external auditory canals.  Nasal mucosa is erythematous and edematous with purulent, tenacious yellow-green discharge in both nares.  Frontal sinuses are not tender to percussion but bilateral maxillary sinuses are.  Oropharyngeal exam reveals posterior  oropharyngeal erythema with yellow-green postnasal drip.  No cervical lymphadenopathy appreciated on exam.  Cardiopulmonary exam reveals clear lung sounds all fields.  Patient exam is consistent with an upper respiratory infection and due to the tenacious discharge suspect that it is bacterial in nature despite the short course of illness.  Will treat with Augmentin twice daily for 10 days, Atrovent nasal spray to help with nasal congestion and promote sinus drainage, and sinus irrigation.  I have also prescribed Diflucan to the patient as she frequently gets a yeast infection with antibiotic therapy.  Patient denies need for work note.   Final Clinical Impressions(s) / UC Diagnoses   Final diagnoses:  Acute upper respiratory infection     Discharge Instructions      The Augmentin twice daily with food for 10 days for treatment of your upper respiratory infection.  Perform sinus irrigation 2-3 times a day with a NeilMed sinus rinse kit and distilled water.  Do not use tap water.  You can use plain over-the-counter Mucinex every 6 hours to break up the stickiness of the mucus so your body can clear it.  Increase your oral fluid intake to thin out your mucus so that is also able for your body to clear more easily.  Take an over-the-counter probiotic, such as Culturelle-align-activia, 1 hour after each dose of antibiotic to prevent diarrhea.  Take the Atrovent for your nasal congestion. Two squirts in each nostril every 6 hours as needed.   If you develop  symptoms of a yeast infection take one Diflucan at onset and repeat in 1 week if necessary.   If you develop any new or worsening symptoms return for reevaluation or see your primary care provider.      ED Prescriptions     Medication Sig Dispense Auth. Provider   amoxicillin-clavulanate (AUGMENTIN) 875-125 MG tablet Take 1 tablet by mouth every 12 (twelve) hours for 10 days. 20 tablet Margarette Canada, NP   ipratropium (ATROVENT) 0.06  % nasal spray Place 2 sprays into both nostrils 4 (four) times daily. 15 mL Margarette Canada, NP   fluconazole (DIFLUCAN) 200 MG tablet Take 1 tablet (200 mg total) by mouth daily for 2 doses. 2 tablet Margarette Canada, NP      PDMP not reviewed this encounter.   Margarette Canada, NP 07/06/21 1056

## 2021-07-06 NOTE — Telephone Encounter (Signed)
Can you see if Dr B will open a 3:20 for this patient and let patient know.  If not I guess [facility]. Thanks      Copied from [place] [phone]. Topic: Appointment Scheduling - Scheduling Inquiry for Clinic >> Jul 06, 2021  8:34 AM Celene Kras wrote: Reason for CRM: Pt called and is requesting to have an appt with a provider due to the possibility of flu. Spoke with Jiles Garter in office who advised pt to go to [facility] for a flu test and to send a CRM to providers. Pt states understanding. Please advise.

## 2021-07-06 NOTE — Discharge Instructions (Signed)
The Augmentin twice daily with food for 10 days for treatment of your upper respiratory infection.  Perform sinus irrigation 2-3 times a day with a NeilMed sinus rinse kit and distilled water.  Do not use tap water.  You can use plain over-the-counter Mucinex every 6 hours to break up the stickiness of the mucus so your body can clear it.  Increase your oral fluid intake to thin out your mucus so that is also able for your body to clear more easily.  Take an over-the-counter probiotic, such as Culturelle-align-activia, 1 hour after each dose of antibiotic to prevent diarrhea.  Take the Atrovent for your nasal congestion. Two squirts in each nostril every 6 hours as needed.   If you develop symptoms of a yeast infection take one Diflucan at onset and repeat in 1 week if necessary.   If you develop any new or worsening symptoms return for reevaluation or see your primary care provider.

## 2021-07-06 NOTE — Telephone Encounter (Signed)
Please review

## 2021-07-06 NOTE — Telephone Encounter (Signed)
Can offer any available appts with any provider, [facility] visit, [place] offices for acute visit. I do not have any openings currently. Sorry

## 2021-07-06 NOTE — Telephone Encounter (Signed)
LMTCB-If patient calls back ok to let her know what the provider said.

## 2021-07-06 NOTE — ED Triage Notes (Signed)
Pt here with C/O Pressure in head, headache, body aches, fever (98.6) for 3 days

## 2021-07-07 NOTE — Telephone Encounter (Signed)
Patient was seen at UC.

## 2021-07-13 ENCOUNTER — Ambulatory Visit: Admission: EM | Admit: 2021-07-13 | Payer: BC Managed Care – PPO

## 2021-07-13 ENCOUNTER — Ambulatory Visit
Admission: EM | Admit: 2021-07-13 | Discharge: 2021-07-13 | Disposition: A | Discharge status: Home / Self Care | Payer: BC Managed Care – PPO | Attending: Emergency Medicine | Admitting: Emergency Medicine

## 2021-07-13 ENCOUNTER — Other Ambulatory Visit: Payer: Self-pay

## 2021-07-13 ENCOUNTER — Encounter: Payer: Self-pay | Admitting: Emergency Medicine

## 2021-07-13 DIAGNOSIS — J101 Influenza due to other identified influenza virus with other respiratory manifestations: Secondary | ICD-10-CM

## 2021-07-13 LAB — POCT INFLUENZA A/B
Influenza A, POC: POSITIVE — AB
Influenza B, POC: NEGATIVE

## 2021-07-13 NOTE — Discharge Instructions (Addendum)
Stop Augmentin  You have been diagnosed with influenza. This is typically a self-limiting virus that does not require antibiotics. Most people will have symptoms for about 7 - 10 days. Pay special attention to handwashing as this can prevent spread of the virus.   Always read the labels of cough and cold medications as they may contain some of the ingredients below.  Rest, push lots of fluids (especially water), and utilize supportive care for symptoms. You may take acetaminophen (Tylenol) every 4-6 hours and ibuprofen every 6-8 hours for muscle pain, joint pain, headaches (you may also alternate these medications). Mucinex (guaifenesin) may be taken over the counter for cough as needed can loosen phlegm. Please read the instructions and take as directed.  Sudafed (pseudophedrine) is sold behind the counter and can help reduce nasal pressure; avoid taking this if you have high blood pressure or feel jittery. Sudafed PE (phenylephrine) can be a helpful, short-term, over-the-counter alternative to limit side effects or if you have high blood pressure.  Flonase nasal spray can help alleviate congestion and sinus pressure. Many patients choose Afrin as a nasal decongestant; do not use for more than 3 days for risk of rebound (increased symptoms after stopping medication).  Saline nasal sprays or rinses can also help nasal congestion (use bottled or sterile water). Warm tea with lemon and honey can sooth sore throat and cough, as can cough drops.   Return to clinic for high fever not improving with medications, chest pain, difficulty breathing, non-stop vomiting, or coughing blood. Follow-up with your primary care provider if symptoms do not improve as expected in the next 5-7 days.

## 2021-07-13 NOTE — ED Provider Notes (Signed)
CHIEF COMPLAINT:   Chief Complaint  Patient presents with   Not better     SUBJECTIVE/HPI:  HPI A very pleasant 25 y.o.Female presents today with cough, headache, fever, body aches and runny nose for 10 days.  Patient reports being seen a week ago at another urgent care center and being started on Augmentin and Atrovent.  Patient reports that these medications have not helped her symptoms.  Patient does not report any shortness of breath, chest pain, palpitations, visual changes, weakness, tingling, nausea, vomiting, diarrhea, chills.   has a past medical history of Asthma, Family history of breast cancer, Increased risk of breast cancer, Mass of breast, right (09/2013), and PONV (postoperative nausea and vomiting).  ROS:  Review of Systems See Subjective/HPI Medications, Allergies and Problem List personally reviewed in Epic today OBJECTIVE:   Vitals:   07/13/21 0954  BP: 114/79  Pulse: 98  Resp: 16  Temp: 99.1 F (37.3 C)  SpO2: 96%    Physical Exam   General: Appears well-developed and well-nourished. No acute distress.  HEENT Head: Normocephalic and atraumatic. Ears: Hearing grossly intact, no drainage or visible deformity.  Nose: No nasal deviation.   Mouth/Throat: No stridor or tracheal deviation.  Non erythematous posterior pharynx noted with clear drainage present.  No white patchy exudate noted. Eyes: Conjunctivae and EOM are normal. No eye drainage or scleral icterus bilaterally.  Neck: Normal range of motion, neck is supple. Cardiovascular: Normal rate. Regular rhythm; no murmurs, gallops, or rubs.  Pulm/Chest: No respiratory distress. Breath sounds normal bilaterally without wheezes, rhonchi, or rales.  Neurological: Alert and oriented to person, place, and time.  Skin: Skin is warm and dry.  No rashes, lesions, abrasions or bruising noted to skin.   Psychiatric: Normal mood, affect, behavior, and thought content.   Vital signs and nursing note reviewed.    Patient stable and cooperative with examination. PROCEDURES:    LABS/X-RAYS/EKG/MEDS:   Results for orders placed or performed during the hospital encounter of 07/13/21  POCT Influenza A/B  Result Value Ref Range   Influenza A, POC Positive (A) Negative   Influenza B, POC Negative Negative    MEDICAL DECISION MAKING:   Patient presents with cough, headache, fever, body aches and runny nose for 10 days.  Patient reports being seen a week ago at another urgent care center and being started on Augmentin and Atrovent.  Patient reports that these medications have not helped her symptoms.  Patient does not report any shortness of breath, chest pain, palpitations, visual changes, weakness, tingling, nausea, vomiting, diarrhea, chills.  Influenza positive.  Advised to stop the use of Augmentin as previously prescribed.  Rest, push fluids, Tylenol versus ibuprofen, Mucinex versus Sudafed.  Return to clinic for new high fever not improving with medications, chest pain, difficulty breathing, nonstop vomiting or coughing up blood.  Follow-up with PCP if symptoms do not improve as expected in the next 5 to 7 days.  Patient verbalized understanding and agreed with treatment plan.  Patient stable upon discharge. ASSESSMENT/PLAN:  1. Influenza A  Plan:   Discharge Instructions      Stop Augmentin  You have been diagnosed with influenza. This is typically a self-limiting virus that does not require antibiotics. Most people will have symptoms for about 7 - 10 days. Pay special attention to handwashing as this can prevent spread of the virus.   Always read the labels of cough and cold medications as they may contain some of the ingredients below.  Rest,  push lots of fluids (especially water), and utilize supportive care for symptoms. You may take acetaminophen (Tylenol) every 4-6 hours and ibuprofen every 6-8 hours for muscle pain, joint pain, headaches (you may also alternate these  medications). Mucinex (guaifenesin) may be taken over the counter for cough as needed can loosen phlegm. Please read the instructions and take as directed.  Sudafed (pseudophedrine) is sold behind the counter and can help reduce nasal pressure; avoid taking this if you have high blood pressure or feel jittery. Sudafed PE (phenylephrine) can be a helpful, short-term, over-the-counter alternative to limit side effects or if you have high blood pressure.  Flonase nasal spray can help alleviate congestion and sinus pressure. Many patients choose Afrin as a nasal decongestant; do not use for more than 3 days for risk of rebound (increased symptoms after stopping medication).  Saline nasal sprays or rinses can also help nasal congestion (use bottled or sterile water). Warm tea with lemon and honey can sooth sore throat and cough, as can cough drops.   Return to clinic for high fever not improving with medications, chest pain, difficulty breathing, non-stop vomiting, or coughing blood. Follow-up with your primary care provider if symptoms do not improve as expected in the next 5-7 days.          Serafina Royals, White 07/13/21 1032

## 2021-07-13 NOTE — ED Triage Notes (Signed)
Pt was seen 1 week ago in [place] and is not better. Pt c/o cough, HA, fever, bodyaches, and runny nose.

## 2021-07-14 ENCOUNTER — Ambulatory Visit: Payer: Self-pay | Admitting: Family Medicine

## 2021-08-10 ENCOUNTER — Telehealth: Payer: Self-pay | Admitting: Physician Assistant

## 2021-08-10 NOTE — Telephone Encounter (Signed)
Medication Refill - Medication: Wixela Inhaler  Has the patient contacted their pharmacy? Yes.  They ask her to call the office (Agent: If no, request that the patient contact the pharmacy for the refill. If patient does not wish to contact the pharmacy document the reason why and proceed with request.) (Agent: If yes, when and what did the pharmacy advise?)  Preferred Pharmacy (with phone number or street name): [facility] Has the patient been seen for an appointment in the last year OR does the patient have an upcoming appointment? Yes.    Agent: Please be advised that RX refills may take up to 3 business days. We ask that you follow-up with your pharmacy.

## 2021-08-11 ENCOUNTER — Telehealth (INDEPENDENT_AMBULATORY_CARE_PROVIDER_SITE_OTHER): Payer: Self-pay | Admitting: Physician Assistant

## 2021-08-11 DIAGNOSIS — J45909 Unspecified asthma, uncomplicated: Secondary | ICD-10-CM

## 2021-08-11 MED ORDER — FLUTICASONE-SALMETEROL 250-50 MCG/ACT IN AEPB: 1.0000 | INHALATION_SPRAY | Freq: Two times a day (BID) | RESPIRATORY_TRACT | 5 refills | Status: DC

## 2021-08-11 MED ORDER — FLUTICASONE-SALMETEROL 250-50 MCG/ACT IN AEPB: 1.0000 | INHALATION_SPRAY | Freq: Two times a day (BID) | RESPIRATORY_TRACT | 0 refills | Status: DC

## 2021-08-11 NOTE — Progress Notes (Signed)
MyChart Video Visit    Virtual Visit via Video Note   This visit type was conducted due to national recommendations for restrictions regarding the COVID-19 Pandemic (e.g. social distancing) in an effort to limit this patient's exposure and mitigate transmission in our community. This patient is at least at moderate risk for complications without adequate follow up. This format is felt to be most appropriate for this patient at this time. Physical exam was limited by quality of the video and audio technology used for the visit.      Patient location: Traveling to [place] to visit family Provider location: [facility], [place], [place]   I discussed the limitations of evaluation and management by telemedicine and the availability of in person appointments. The patient expressed understanding and agreed to proceed.  Patient: Shelley Walton   DOB: 08/30/1995   25 y.o. Female  MRN: [id number] Visit Date: 08/11/2021  Today's healthcare provider: Dani Gobble Ra Pfiester, PA-C   No chief complaint on file.  Subjective    HPI  Patient is requesting refill of her Wixela  Has a follow up apt in Jan 2023 States she ran out a few days ago and is starting to notice increased work of breathing/ mild SOB that usually resolves with medication use.  Denies cough.  States she would prefer Advair to be sent to pharmacy in [place] if possible as that is where she will be over next few days visiting family.    Medications: Outpatient Medications Prior to Visit  Medication Sig   fexofenadine (ALLEGRA) 180 MG tablet Take 180 mg by mouth daily.   ipratropium (ATROVENT) 0.06 % nasal spray Place 2 sprays into both nostrils 4 (four) times daily.   valACYclovir (VALTREX) 1000 MG tablet Take 1g once daily for five days at first sign of outbreak. (Patient not taking: No sig reported)   WIXELA INHUB 250-50 MCG/DOSE AEPB INHALE 1 PUFF INTO THE LUNGS TWICE DAILY   No facility-administered medications  prior to visit.    Review of Systems  Constitutional:  Negative for appetite change, chills, fatigue and fever.  Respiratory:  Negative for chest tightness and shortness of breath.   Cardiovascular:  Negative for chest pain and palpitations.  Gastrointestinal:  Negative for abdominal pain, nausea and vomiting.  Neurological:  Negative for dizziness and weakness.     Objective    LMP 11/23/2020 (Approximate)    Physical Exam   Physical exam limited due to nature of virtual visit.  Patient is a well-appearing, well-developed female in no acute distress by observation.  No cough, scleral injection, difficulty breathing noted during interaction.    Assessment & Plan     Problem List Items Addressed This Visit       Respiratory   Asthma - Primary    Patient engaged in virtual visit for chronic asthma maintenance  Patient requests refill of Advair to be sent to [place] for her to pick up while she is visiting family States she will keep apt in Jan 2023  Further refills are available at local preferred pharmacy.       Relevant Medications   fluticasone-salmeterol (ADVAIR) 250-50 MCG/ACT AEPB     No follow-ups on file.   I, Suhan Paci E Yvonda Fouty, PA-C, have reviewed all documentation for this visit. The documentation on 08/11/21 for the exam, diagnosis, procedures, and orders are all accurate and complete.   Takelia Urieta, [place] MPH [facility]    No  follow-ups on file.     I discussed the assessment and treatment plan with the patient. The patient was provided an opportunity to ask questions and all were answered. The patient agreed with the plan and demonstrated an understanding of the instructions.   The patient was advised to call back or seek an in-person evaluation if the symptoms worsen or if the condition fails to improve as anticipated.  I provided 15 minutes of non-face-to-face time during this  encounter.    Almon Register, PA-C [employer] [phone] (phone) [phone] (fax)  [place]

## 2021-08-11 NOTE — Telephone Encounter (Signed)
Please advise refill?  LOV: 08/09/2020 NOV: 09/11/2021 Last refill: 10/09/2020 #60 w/5 refills  Medication is no longer available. Pharmacy is requesting an alternative.

## 2021-08-11 NOTE — Assessment & Plan Note (Signed)
Patient engaged in virtual visit for chronic asthma maintenance  Patient requests refill of Advair to be sent to [place] for her to pick up while she is visiting family States she will keep apt in Jan 2023  Further refills are available at local preferred pharmacy.

## 2021-08-11 NOTE — Telephone Encounter (Signed)
Unable to request reorder/RF- sent for review and new Rx  Message:The following medication records are no longer available for ordering. Place a new order with a different medication record. WIXELA INHUB 250-50 MCG/DOSE AEPB

## 2021-08-11 NOTE — Telephone Encounter (Signed)
Patient advised. Requested for Advair Rx be send to [place], [place]

## 2021-09-11 ENCOUNTER — Encounter: Payer: Self-pay | Admitting: Family Medicine

## 2021-09-11 ENCOUNTER — Other Ambulatory Visit: Payer: Self-pay

## 2021-09-11 ENCOUNTER — Ambulatory Visit (INDEPENDENT_AMBULATORY_CARE_PROVIDER_SITE_OTHER): Payer: BC Managed Care – PPO | Admitting: Family Medicine

## 2021-09-11 VITALS — BP 104/72 | HR 83 | Temp 97.7°F | Resp 16 | Wt 160.0 lb

## 2021-09-11 DIAGNOSIS — Z0000 Encounter for general adult medical examination without abnormal findings: Secondary | ICD-10-CM

## 2021-09-11 DIAGNOSIS — J4540 Moderate persistent asthma, uncomplicated: Secondary | ICD-10-CM | POA: Diagnosis not present

## 2021-09-11 DIAGNOSIS — Z1159 Encounter for screening for other viral diseases: Secondary | ICD-10-CM

## 2021-09-11 NOTE — Progress Notes (Signed)
Complete physical exam   Patient: Shelley Walton   DOB: 1996-02-16   25 y.o. Female  MRN: [id number] Visit Date: 09/11/2021  Today's healthcare provider: Lavon Paganini, MD   Chief Complaint  Patient presents with   Annual Exam   Subjective    Shelley Walton is a 26 y.o. female who presents today for a complete physical exam.  She reports consuming a general diet. Gym/ health club routine includes cardio. She generally feels fairly well. She reports sleeping well. She does not have additional problems to discuss today.  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 Donne Hazel   PONV (postoperative nausea and vomiting)    Past Surgical History:  Procedure Laterality Date   MASS EXCISION Right 10/14/2013   Procedure: RIGHT BREAST MASS EXCISION;  Surgeon: Rolm Bookbinder, MD;  Location: [facility];  Service: General;  Laterality: Right;   RADIOACTIVE SEED GUIDED EXCISIONAL BREAST BIOPSY Left 07/16/2019   Procedure: RADIOACTIVE SEED GUIDED EXCISIONAL LEFT BREAST BIOPSY;  Surgeon: Rolm Bookbinder, MD;  Location: [facility];  Service: General;  Laterality: Left;   WISDOM TOOTH EXTRACTION     Social History   Socioeconomic History   Marital status: Married    Spouse name: Not on file   Number of children: Not on file   Years of education: Not on file   Highest education level: Not on file  Occupational History   Occupation: student    Comment: Planning to go to [employer] in fall for elementary educatoin.    Occupation: works at [employer]   Smoking status: Never   Smokeless tobacco: Never  Vaping Use   Vaping Use: Never used  Substance and Sexual Activity   Alcohol use: Not Currently    Comment: socially    Drug use: No   Sexual activity: Yes    Birth control/protection: None  Other Topics Concern   Not on file  Social History Narrative   Not on file   Social Determinants of Health   Financial Resource Strain: Not on file  Food Insecurity: Not on file  Transportation Needs: Not on file  Physical Activity: Not on file  Stress: Not on file  Social Connections: Not on file  Intimate Partner Violence: Not on file   Family Status  Relation Name Status   Mother  Alive   MGF  Alive   Father  Alive   MGM  (Not Specified)   Administrator  (Not Specified)   Brother  Alive   Brother  Alive   Other [place] Alive   Family History  Problem Relation Age of Onset   Breast cancer Mother 73       myRisk neg   Multiple sclerosis Mother    Diabetes Maternal Grandfather    Hypertension Maternal Grandfather    Heart disease Maternal Grandfather    Diabetes Maternal Grandmother    Multiple sclerosis Maternal Uncle    Healthy Brother    Healthy Brother    Breast cancer Other 31   No Known Allergies  Patient Care Team: Biran Mayberry, Dionne Bucy, MD as PCP - General (Family Medicine)   Medications: Outpatient Medications Prior  to Visit  Medication Sig   montelukast (SINGULAIR) 10 MG tablet Take 10 mg by mouth at bedtime.   fexofenadine (ALLEGRA) 180 MG tablet Take 180 mg by mouth daily.   fluticasone-salmeterol (ADVAIR DISKUS) 250-50 MCG/ACT AEPB Inhale 1 puff into the lungs in the morning and at bedtime.   ipratropium (ATROVENT) 0.06 % nasal spray Place 2 sprays into both nostrils 4 (four) times daily.   valACYclovir (VALTREX) 1000 MG tablet Take 1g once daily for five days at first sign of outbreak. (Patient not taking: Reported on 03/13/2021)   [DISCONTINUED] fluticasone-salmeterol (ADVAIR) 250-50 MCG/ACT AEPB Inhale 1 puff into the lungs in the morning and at bedtime for 60 doses.   No facility-administered medications prior to visit.    Review of Systems  Constitutional: Negative.   HENT:   Positive for congestion.   Eyes: Negative.   Respiratory: Negative.    Cardiovascular: Negative.   Gastrointestinal:  Positive for abdominal distention.  Endocrine: Negative.   Genitourinary: Negative.   Musculoskeletal: Negative.   Skin: Negative.   Allergic/Immunologic: Negative.   Neurological: Negative.   Hematological: Negative.   Psychiatric/Behavioral: Negative.     Last CBC Lab Results  Component Value Date   WBC 5.9 10/03/2015   HGB 13.3 10/03/2015   HCT 39.4 10/03/2015   MCV 89.0 10/03/2015   MCH 30.0 10/03/2015   RDW 12.5 10/03/2015   PLT 270 19/41/7408   Last metabolic panel Lab Results  Component Value Date   GLUCOSE 81 10/03/2015   NA 138 10/03/2015   K 3.5 10/03/2015   CL 106 10/03/2015   CO2 23 10/03/2015   BUN 12 10/03/2015   CREATININE 0.69 10/03/2015   GFRNONAA >60 10/03/2015   CALCIUM 9.0 10/03/2015   PROT 7.0 10/03/2015   ALBUMIN 3.9 10/03/2015   BILITOT 0.5 10/03/2015   ALKPHOS 32 (L) 10/03/2015   AST 18 10/03/2015   ALT 10 (L) 10/03/2015   ANIONGAP 9 10/03/2015   Last lipids No results found for: CHOL, HDL, LDLCALC, LDLDIRECT, TRIG, CHOLHDL Last hemoglobin A1c No results found for: HGBA1C Last thyroid functions No results found for: TSH, T3TOTAL, T4TOTAL, THYROIDAB    Objective    BP 104/72 (BP Location: Left Arm, Patient Position: Sitting, Cuff Size: Large)    Pulse 83    Temp 97.7 F (36.5 C) (Temporal)    Resp 16    Wt 160 lb (72.6 kg)    LMP 08/28/2021 (Approximate)    BMI 27.46 kg/m  BP Readings from Last 3 Encounters:  09/11/21 104/72  07/13/21 114/79  07/06/21 108/68   Wt Readings from Last 3 Encounters:  09/11/21 160 lb (72.6 kg)  07/06/21 160 lb (72.6 kg)  05/26/21 160 lb (72.6 kg)   PHQ 2/9 Scores 09/11/2021 05/18/2020 03/29/2017  PHQ - 2 Score 0 3 0  PHQ- 9 Score 2 11 3    Physical Exam Constitutional:      General: She is not in acute distress.    Appearance: Normal appearance. She is not diaphoretic.  HENT:      Head: Normocephalic and atraumatic.     Right Ear: Tympanic membrane, ear canal and external ear normal.     Left Ear: Tympanic membrane, ear canal and external ear normal.     Nose: Nose normal.     Mouth/Throat:     Mouth: Mucous membranes are moist.     Comments: Bilateral tonsil stones Eyes:     General: No scleral icterus.    Conjunctiva/sclera: Conjunctivae  normal.     Pupils: Pupils are equal, round, and reactive to light.  Cardiovascular:     Rate and Rhythm: Normal rate and regular rhythm.     Pulses: Normal pulses.     Heart sounds: Normal heart sounds. No murmur heard. Pulmonary:     Effort: Pulmonary effort is normal. No respiratory distress.     Breath sounds: Normal breath sounds. No wheezing.  Abdominal:     General: There is no distension.     Palpations: Abdomen is soft.     Tenderness: There is no abdominal tenderness.  Musculoskeletal:     Cervical back: Neck supple.     Right lower leg: No edema.     Left lower leg: No edema.  Lymphadenopathy:     Cervical: No cervical adenopathy.  Skin:    General: Skin is warm and dry.     Findings: No rash.  Neurological:     Mental Status: She is alert and oriented to person, place, and time. Mental status is at baseline.  Psychiatric:        Mood and Affect: Mood normal.        Behavior: Behavior normal.     Last fall risk screening Fall Risk  09/11/2021  Falls in the past year? 0  Number falls in past yr: 0  Injury with Fall? 0  Risk for fall due to : No Fall Risks  Follow up Falls evaluation completed   Last Audit-C alcohol use screening Alcohol Use Disorder Test (AUDIT) 09/11/2021  1. How often do you have a drink containing alcohol? 2  2. How many drinks containing alcohol do you have on a typical day when you are drinking? 1  3. How often do you have six or more drinks on one occasion? 0  AUDIT-C Score 3  4. How often during the last year have you found that you were not able to stop drinking once you  had started? 0  5. How often during the last year have you failed to do what was normally expected from you because of drinking? 0  6. How often during the last year have you needed a first drink in the morning to get yourself going after a heavy drinking session? 0  7. How often during the last year have you had a feeling of guilt of remorse after drinking? 0  8. How often during the last year have you been unable to remember what happened the night before because you had been drinking? 0  9. Have you or someone else been injured as a result of your drinking? 0  10. Has a relative or friend or a doctor or another health worker been concerned about your drinking or suggested you cut down? 0  Alcohol Use Disorder Identification Test Final Score (AUDIT) 3  Alcohol Brief Interventions/Follow-up -   A score of 3 or more in women, and 4 or more in men indicates increased risk for alcohol abuse, EXCEPT if all of the points are from question 1   No results found for any visits on 09/11/21.  Assessment & Plan    Routine Health Maintenance and Physical Exam  Exercise Activities and Dietary recommendations  Goals   None      There is no immunization history on file for this patient.  Health Maintenance  Topic Date Due   Pneumococcal Vaccine 70-72 Years old (1 - PCV) Never done   Hepatitis C Screening  Never done   TETANUS/TDAP  09/27/2021 (Originally 04/06/2016)   INFLUENZA VACCINE  11/24/2021 (Originally 03/27/2021)   HPV VACCINES (1 - 2-dose series) 09/11/2022 (Originally 04/06/2008)   [place]  11/16/2021   PAP-Cervical Cytology Screening  02/08/2024   PAP SMEAR-Modifier  02/08/2024   HIV Screening  Completed    Discussed health benefits of physical activity, and encouraged her to engage in regular exercise appropriate for her age and condition.  Problem List Items Addressed This Visit       Respiratory   Asthma    Chronic and well controlled Continue current meds       Relevant Medications   montelukast (SINGULAIR) 10 MG tablet   Other Visit Diagnoses     Encounter for annual physical exam    -  Primary   Relevant Orders   Comprehensive metabolic panel   CBC w/Diff/Platelet   Hepatitis C Antibody   Need for hepatitis C screening test       Relevant Orders   Hepatitis C Antibody        Return in about 1 year (around 09/11/2022) for CPE.     I, Lavon Paganini, MD, have reviewed all documentation for this visit. The documentation on 09/11/21 for the exam, diagnosis, procedures, and orders are all accurate and complete.   Eileen Kangas, Dionne Bucy, MD, MPH [facility]

## 2021-09-11 NOTE — Assessment & Plan Note (Signed)
Chronic and well controlled Continue current meds

## 2021-09-12 LAB — COMPREHENSIVE METABOLIC PANEL
ALT: 10 IU/L (ref 0–32)
AST: 14 IU/L (ref 0–40)
Albumin/Globulin Ratio: 1.9 (ref 1.2–2.2)
Albumin: 4.6 g/dL (ref 3.9–5.0)
Alkaline Phosphatase: 74 IU/L (ref 44–121)
BUN/Creatinine Ratio: 13 (ref 9–23)
BUN: 10 mg/dL (ref 6–20)
Bilirubin Total: 0.2 mg/dL (ref 0.0–1.2)
CO2: 25 mmol/L (ref 20–29)
Calcium: 10 mg/dL (ref 8.7–10.2)
Chloride: 102 mmol/L (ref 96–106)
Creatinine, Ser: 0.78 mg/dL (ref 0.57–1.00)
Globulin, Total: 2.4 g/dL (ref 1.5–4.5)
Glucose: 86 mg/dL (ref 70–99)
Potassium: 4.7 mmol/L (ref 3.5–5.2)
Sodium: 140 mmol/L (ref 134–144)
Total Protein: 7 g/dL (ref 6.0–8.5)
eGFR: 109 mL/min/{1.73_m2} (ref >59)

## 2021-09-12 LAB — CBC WITH DIFFERENTIAL/PLATELET
Basophils Absolute: 0.1 10*3/uL (ref 0.0–0.2)
Basos: 1 %
EOS (ABSOLUTE): 1 10*3/uL — ABNORMAL HIGH (ref 0.0–0.4)
Eos: 10 %
Hematocrit: 43.2 % (ref 34.0–46.6)
Hemoglobin: 14.6 g/dL (ref 11.1–15.9)
Immature Grans (Abs): 0 10*3/uL (ref 0.0–0.1)
Immature Granulocytes: 0 %
Lymphocytes Absolute: 1.9 10*3/uL (ref 0.7–3.1)
Lymphs: 19 %
MCH: 30.4 pg (ref 26.6–33.0)
MCHC: 33.8 g/dL (ref 31.5–35.7)
MCV: 90 fL (ref 79–97)
Monocytes Absolute: 0.6 10*3/uL (ref 0.1–0.9)
Monocytes: 7 %
Neutrophils Absolute: 6.3 10*3/uL (ref 1.4–7.0)
Neutrophils: 63 %
Platelets: 376 10*3/uL (ref 150–450)
RBC: 4.81 x10E6/uL (ref 3.77–5.28)
RDW: 12.2 % (ref 11.7–15.4)
WBC: 9.9 10*3/uL (ref 3.4–10.8)

## 2021-09-12 LAB — HEPATITIS C ANTIBODY: Hep C Virus Ab: <0.1 s/co ratio (ref 0.0–0.9)

## 2021-09-14 ENCOUNTER — Other Ambulatory Visit: Payer: Self-pay | Admitting: Physician Assistant

## 2021-09-14 NOTE — Telephone Encounter (Signed)
Requested medication (s) are due for refill today:   Yes  Requested medication (s) are on the active medication list:   No  Per notes [facility] R[facility] is generic for Advair.  Per pharmacy Wixela no longer available.  Future visit scheduled:   Yes   Seen 3 days ago for CPE   Last ordered: Not on medication list  Returned because a 90 day supply is being requested for the Advair.  (See notes pertaining to this rx)  Requested Prescriptions  Pending Prescriptions Disp Refills   WIXELA INHUB 250-50 MCG/ACT AEPB [Pharmacy Med Name: WIXELA INHUB DISKUS 250/50MCG 60S] 180 each     Sig: INHALE 1 PUFF INTO THE LUNGS IN THE MORNING AND AT BEDTIME FOR 60 DOSES     Pulmonology:  Combination Products Passed - 09/14/2021  8:39 AM      Passed - Valid encounter within last 12 months    Recent Outpatient Visits           3 days ago Encounter for annual physical exam   [facility] [place], Dionne Bucy, MD   1 month ago Uncomplicated asthma, unspecified asthma severity, unspecified whether persistent   [employer], Dani Gobble, PA-C   1 year ago Acute non-recurrent frontal sinusitis   [facility] [place], Wendee Beavers, [place]   1 year ago [facility], Wendee Beavers, [place]   1 year ago [place], Wendee Beavers, PA-C       Future Appointments             In 1 year Bacigalupo, Dionne Bucy, MD [facility], [place]

## 2021-09-18 ENCOUNTER — Other Ambulatory Visit: Payer: Self-pay

## 2021-09-18 ENCOUNTER — Telehealth: Payer: Self-pay | Admitting: Family Medicine

## 2021-09-18 MED ORDER — MONTELUKAST SODIUM 10 MG PO TABS: 10.0000 mg | ORAL_TABLET | Freq: Every day | ORAL | 0 refills | Status: DC

## 2021-09-18 NOTE — Telephone Encounter (Signed)
[facility] faxed refill request for the following medications:  montelukast (SINGULAIR) 10 MG tablet   Please advise.

## 2021-09-19 ENCOUNTER — Encounter: Payer: Self-pay | Admitting: Family Medicine

## 2021-10-02 ENCOUNTER — Ambulatory Visit: Payer: BC Managed Care – PPO | Admitting: Licensed Practical Nurse

## 2021-10-02 ENCOUNTER — Other Ambulatory Visit: Payer: Self-pay

## 2021-10-02 VITALS — BP 118/74 | Ht 64.0 in | Wt 164.0 lb

## 2021-10-02 DIAGNOSIS — Z3169 Encounter for other general counseling and advice on procreation: Secondary | ICD-10-CM | POA: Diagnosis not present

## 2021-10-02 NOTE — Progress Notes (Signed)
SUBJECTIVE:  Here for counseling related to TTC.  Had a early miscarriage in July, her cycle returned 3-4 weeks later. Has not been using contraception, desires a pregnancy and has not gotten pregnant.  Wonders if there is anything to be concerned about. Has cycles every 28-35 days.  Is unaware of her signs of ovulation.  Has not used OPK, has an apt that tracks her cycle. Reports they have IC 3 times a month.  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 Donne Hazel   PONV (postoperative nausea and vomiting)      Current Outpatient Medications on File Prior to Visit  Medication Sig Dispense Refill   fexofenadine (ALLEGRA) 180 MG tablet Take 180 mg by mouth daily.     fluticasone-salmeterol (WIXELA INHUB) 250-50 MCG/ACT AEPB INHALE 1 PUFF INTO THE LUNGS IN THE MORNING AND AT BEDTIME FOR 60 DOSES 180 each 1   ipratropium (ATROVENT) 0.06 % nasal spray Place 2 sprays into both nostrils 4 (four) times daily. 15 mL 12   montelukast (SINGULAIR) 10 MG tablet Take 1 tablet (10 mg total) by mouth at bedtime. 90 tablet 0   valACYclovir (VALTREX) 1000 MG tablet Take 1g once daily for five days at first sign of outbreak. (Patient not taking: Reported on 03/13/2021) 30 tablet 5   [DISCONTINUED] ethynodiol-ethinyl estradiol (ZOVIA) 1-35 MG-MCG tablet Take 1 tablet by mouth daily. 84 tablet 3   No current facility-administered medications on file prior to visit.     OBJECTIVE: BP 118/74    Ht _0  (1.626 m)    Wt 164 lb (74.4 kg)    BMI 28.15 kg/m   Reviewed [place] form time of miscarraige as Shelley Walton wonders if the "mass" is of any concern.  Amnion: visualized and appears normal  The gestational sac has an  irregular contour and there is an area that appears to be be adjacent to the gestational sac. This area is small.  Another area to her right and anterior also appears to be a small subchorionic hemorrhage.     -Reviewed this finding probably does not have any significance on her current situation.   Gen: well appearing PE not done today   Pap 02/07/2021 WNL   ASSESSMENT: Healthy 26 y/o TTC  PLAN: -recommend obtaining OPK and tracking her own signs of ovulation. Reiterated that they need to have more frequent unprotected IC in order to conceive. It is possible that she was not having IC during her fertile time. She needs to have IC every other day around her fertile window.  If after 6 months of frequent IC she does not conceive, she shoud return for an evaluation.   -continue PNV, ok to try fertility teas if desired   -Consider serial BHCG, reviewed recommendations on this, and early [place] once pregnant-for ease of mind.  Roberto Scales, [place], [facility]  10/02/21  6:01 PM

## 2021-10-03 ENCOUNTER — Ambulatory Visit: Payer: BC Managed Care – PPO | Admitting: Licensed Practical Nurse

## 2022-01-11 ENCOUNTER — Other Ambulatory Visit: Payer: Self-pay | Admitting: Family Medicine

## 2022-01-11 NOTE — Telephone Encounter (Signed)
Requested Prescriptions  Pending Prescriptions Disp Refills  . montelukast (SINGULAIR) 10 MG tablet [Pharmacy Med Name: MONTELUKAST '10MG'$  TABLETS] 90 tablet 1    Sig: TAKE 1 TABLET(10 MG) BY MOUTH AT BEDTIME     Pulmonology:  Leukotriene Inhibitors Passed - 01/11/2022  4:07 PM      Passed - Valid encounter within last 12 months    Recent Outpatient Visits          4 months ago Encounter for annual physical exam   [facility] [place], Dionne Bucy, MD   5 months ago Uncomplicated asthma, unspecified asthma severity, unspecified whether persistent   [employer], Dani Gobble, PA-C   1 year ago Acute non-recurrent frontal sinusitis   [facility] [place], Wendee Beavers, [place]   1 year ago [facility] [place], Wendee Beavers, [place]   1 year ago [place], Wendee Beavers, [place]      Future Appointments            In 8 months Bacigalupo, Dionne Bucy, MD [facility], [place]

## 2022-01-12 ENCOUNTER — Ambulatory Visit: Payer: Self-pay | Admitting: *Deleted

## 2022-01-12 ENCOUNTER — Encounter: Payer: Self-pay | Admitting: Physician Assistant

## 2022-01-12 ENCOUNTER — Ambulatory Visit: Payer: BC Managed Care – PPO | Admitting: Physician Assistant

## 2022-01-12 VITALS — BP 107/68 | HR 73 | Temp 98.9°F | Resp 16 | Ht 64.0 in | Wt 166.7 lb

## 2022-01-12 DIAGNOSIS — J4541 Moderate persistent asthma with (acute) exacerbation: Secondary | ICD-10-CM | POA: Diagnosis not present

## 2022-01-12 MED ORDER — DOXYCYCLINE HYCLATE 100 MG PO TABS: 100.0000 mg | ORAL_TABLET | Freq: Two times a day (BID) | ORAL | 0 refills | Status: AC

## 2022-01-12 NOTE — Progress Notes (Signed)
Established patient visit   Patient: Shelley Walton   DOB: [birth year]   25 y.o. Female  MRN: [id number] Visit Date: 01/12/2022  Today's healthcare provider: Mikey Kirschner, PA-C   I,Tiffany J Bragg,acting as a scribe for Mikey Kirschner, PA-C.,have documented all relevant documentation on the behalf of Mikey Kirschner, PA-C,as directed by  Mikey Kirschner, PA-C while in the presence of Mikey Kirschner, PA-C.  Chief Complaint  Patient presents with   URI    Patient complains of sinus congestion and pain starting 5 days ago, with R side rib pain. States she had pneumonia over a year ago and it feels similar.    Subjective   Upper respiratory symptoms  Patient is non-smoker Shelley Walton is a 26 y/o female with a history of asthma who presents today with increased nasal congestion, SOB, burning rib pain, sharp pain when breathing for the last week. Reports having COVID pneumonia and it feeling similarly. Denies body aches, fevers, headaches. Denies needing her albuterol inhaler more than usual.   ---------------------------------------------------------------------------------------------------   Medications: Outpatient Medications Prior to Visit  Medication Sig   fexofenadine (ALLEGRA) 180 MG tablet Take 180 mg by mouth daily.   fluticasone-salmeterol (WIXELA INHUB) 250-50 MCG/ACT AEPB INHALE 1 PUFF INTO THE LUNGS IN THE MORNING AND AT BEDTIME FOR 60 DOSES   ipratropium (ATROVENT) 0.06 % nasal spray Place 2 sprays into both nostrils 4 (four) times daily.   montelukast (SINGULAIR) 10 MG tablet TAKE 1 TABLET(10 MG) BY MOUTH AT BEDTIME   valACYclovir (VALTREX) 1000 MG tablet Take 1g once daily for five days at first sign of outbreak.   No facility-administered medications prior to visit.    Review of Systems  Constitutional:  Negative for fatigue and fever.  HENT:  Positive for congestion and rhinorrhea.   Respiratory:  Positive for cough and chest tightness. Negative for shortness of  breath.   Cardiovascular:  Negative for chest pain and leg swelling.  Gastrointestinal:  Negative for abdominal pain.  Neurological:  Negative for dizziness and headaches.      Objective    BP 107/68 (BP Location: Right Arm, Patient Position: Sitting, Cuff Size: Normal)   Pulse 73   Temp 98.9 F (37.2 C) (Oral)   Resp 16   Ht '5\' 4"'$  (1.626 m)   Wt 166 lb 11.2 oz (75.6 kg)   SpO2 99%   BMI 28.61 kg/m   Physical Exam Constitutional:      General: She is awake.     Appearance: She is well-developed.  HENT:     Head: Normocephalic.  Eyes:     Conjunctiva/sclera: Conjunctivae normal.  Cardiovascular:     Rate and Rhythm: Normal rate and regular rhythm.     Heart sounds: Normal heart sounds.  Pulmonary:     Effort: Pulmonary effort is normal.     Breath sounds: Normal breath sounds. No stridor. No wheezing, rhonchi or rales.  Skin:    General: Skin is warm.  Neurological:     Mental Status: She is alert and oriented to person, place, and time.  Psychiatric:        Attention and Perception: Attention normal.        Mood and Affect: Mood normal.        Speech: Speech normal.        Behavior: Behavior is cooperative.     No results found for any visits on 01/12/22.  Assessment & Plan     Asthmatic bronchitis Advised pt  to use albuterol inhaler w/ wheezing, SOB or chest tightness. She will continue her treatment for allergic rhinitis for the sinus congestion Increase fluids, rest.  Advised to confirm pneumonia we would need a chest xray but d/t her lack of fever, significant fatigue, I feel comfortable deferring for now If symptoms do not improve by Monday, doxycycline was sent in, BID x 7 days.  Return if symptoms worsen or fail to improve.      I, Mikey Kirschner, PA-C have reviewed all documentation for this visit. The documentation on  01/12/2022 for the exam, diagnosis, procedures, and orders are all accurate and complete.  Mikey Kirschner, PA-C [facility] [address] [place], [place], [address] Office: [phone] Fax: [place]

## 2022-01-12 NOTE — Telephone Encounter (Signed)
Reason for Disposition  [1] Chest pain(s) lasting a few seconds from coughing AND [2] persists > 3 days  Answer Assessment - Initial Assessment Questions 1. LOCATION: "Where does it hurt?"       Chest pain.   I'm congested and have a runny nose and sinus headache.   My lungs and chest are hurting with a deep breath.   I have a sharp pain.   I have asthma.    I had double pneumonia before.  Pain started last night. 2. RADIATION: "Does the pain go anywhere else?" (e.g., into neck, jaw, arms, back)     Pain around the right side worse but on both sides. 3. ONSET: "When did the chest pain begin?" (Minutes, hours or days)      Started Sunday.    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   4. PATTERN "Does the pain come and go, or has it been constant since it started?"  "Does it get worse with exertion?"      Not coughing much.    No fever or sore throat.   No diarrhea and vomiting.    5. DURATION: "How long does it last" (e.g., seconds, minutes, hours)     *No Answer* 6. SEVERITY: "How bad is the pain?"  (e.g., Scale 1-10; mild, moderate, or severe)    - MILD (1-3): doesn't interfere with normal activities     - MODERATE (4-7): interferes with normal activities or awakens from sleep    - SEVERE (8-10): excruciating pain, unable to do any normal activities       *No Answer* 7. CARDIAC RISK FACTORS: "Do you have any history of heart problems or risk factors for heart disease?" (e.g., angina, prior heart attack; diabetes, high blood pressure, high cholesterol, smoker, or strong family history of heart disease)     *No Answer* 8. PULMONARY RISK FACTORS: "Do you have any history of lung disease?"  (e.g., blood clots in lung, asthma, emphysema, birth control pills)     *No Answer* 9. CAUSE: "What do you think is causing the chest pain?"     *No Answer* 10. OTHER SYMPTOMS: "Do you have any other symptoms?" (e.g., dizziness, nausea, vomiting, sweating, fever, difficulty breathing, cough)       *No  Answer* 11. PREGNANCY: "Is there any chance you are pregnant?" "When was your last menstrual period?"       *No Answer*  Protocols used: Chest Pain-A-AH

## 2022-01-12 NOTE — Telephone Encounter (Signed)
  Chief Complaint: chest pain radiating around under ribs on both sides with deep breaths.   Has asthma.  Symptoms: Chest congestion and a runny nose.   Sharp pain with deep breath.   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 Frequency: Started Sunday the chest pain but is worse today. Pertinent Negatives: Patient denies fever or diarrhea/vomiting.   Disposition: '[]'$ ED /'[]'$ Urgent Care (no appt availability in office) / '[x]'$ Appointment(In office/virtual)/ '[]'$  [facility]/ '[]'$ Home Care/ '[]'$ Refused Recommended Disposition /'[]'$ [facility]/ '[]'$  Follow-up with PCP Additional Notes: Appt made for today at 1:20 with Mikey Kirschner, PA-C.

## 2022-01-18 ENCOUNTER — Encounter: Payer: Self-pay | Admitting: Family Medicine

## 2022-01-18 DIAGNOSIS — J358 Other chronic diseases of tonsils and adenoids: Secondary | ICD-10-CM

## 2022-01-18 NOTE — Telephone Encounter (Signed)
Ok to refer to ENT. Please let her know when it is done. Thanks!

## 2022-05-25 ENCOUNTER — Ambulatory Visit
Admission: EM | Admit: 2022-05-25 | Discharge: 2022-05-25 | Disposition: A | Discharge status: Home / Self Care | Payer: BC Managed Care – PPO | Attending: Emergency Medicine | Admitting: Emergency Medicine

## 2022-05-25 ENCOUNTER — Encounter: Payer: Self-pay | Admitting: Emergency Medicine

## 2022-05-25 ENCOUNTER — Other Ambulatory Visit: Payer: Self-pay | Admitting: Physician Assistant

## 2022-05-25 DIAGNOSIS — J069 Acute upper respiratory infection, unspecified: Secondary | ICD-10-CM

## 2022-05-25 LAB — GROUP A STREP BY PCR: Group A Strep by PCR: NOT DETECTED

## 2022-05-25 MED ORDER — LIDOCAINE VISCOUS HCL 2 % MT SOLN: 15.0000 mL | OROMUCOSAL | 0 refills | Status: DC | PRN

## 2022-05-25 NOTE — Discharge Instructions (Addendum)
Your symptoms today are most likely being caused by a virus and should steadily improve in time it can take up to 7 to 10 days before you truly start to see a turnaround however things will get better  Strep test is negative  May gargle and spit lidocaine solution every 4 hours as needed for temporary relief    You can take Tylenol and/or Ibuprofen as needed for fever reduction and pain relief.   For cough: honey 1/2 to 1 teaspoon (you can dilute the honey in water or another fluid).  You can also use guaifenesin and dextromethorphan for cough. You can use a humidifier for chest congestion and cough.  If you don't have a humidifier, you can sit in the bathroom with the hot shower running.      For sore throat: try warm salt water gargles, cepacol lozenges, throat spray, warm tea or water with lemon/honey, popsicles or ice, or OTC cold relief medicine for throat discomfort.   For congestion: take a daily anti-histamine like Zyrtec, Claritin, and a oral decongestant, such as pseudoephedrine.  You can also use Flonase 1-2 sprays in each nostril daily.   It is important to stay hydrated: drink plenty of fluids (water, gatorade/powerade/pedialyte, juices, or teas) to keep your throat moisturized and help further relieve irritation/discomfort.

## 2022-05-25 NOTE — ED Triage Notes (Addendum)
Patient c/o nasal congestion and sore throat that started this morning.  Patient unsure of fevers. Patient had rapid covid test done by school nurse today and was negative.

## 2022-05-25 NOTE — ED Provider Notes (Signed)
[facility]    CSN: [id number] Arrival date & time: 05/25/22  1418      History   Chief Complaint Chief Complaint  Patient presents with   Sore Throat   Nasal Congestion    HPI Shelley Walton is a 26 y.o. female.   Patient presents with sore throat, chills, right-sided ear pain, and nasal congestion beginning today.  Endorses sore throat has significantly worsened throughout the day and is painful to swallow.  Has been able to tolerate some food and liquids.  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  History of asthma.  Home COVID test negative.  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   Contraception, generic surveillance 06/20/2015   History of chlamydia 06/09/2015   Acne 02/24/2015   Asthma 02/24/2015    Past Surgical History:  Procedure Laterality Date   MASS EXCISION Right 10/14/2013   Procedure: RIGHT BREAST MASS EXCISION;  Surgeon: Rolm Bookbinder, MD;  Location: [place];  Service: General;  Laterality: Right;   RADIOACTIVE SEED GUIDED EXCISIONAL BREAST BIOPSY Left 07/16/2019   Procedure: RADIOACTIVE SEED GUIDED EXCISIONAL LEFT BREAST BIOPSY;  Surgeon: Rolm Bookbinder, MD;  Location: [place];  Service: General;  Laterality: Left;   WISDOM TOOTH EXTRACTION      OB History     Gravida  1   Para  0   Term  0   Preterm  0   AB  0   Living  0      SAB  0   IAB  0    Ectopic  0   Multiple  0   Live Births  0            Home Medications    Prior to Admission medications   Medication Sig Start Date End Date Taking? Authorizing Provider  albuterol (VENTOLIN HFA) 108 (90 Base) MCG/ACT inhaler INL 2 PFS ITL Q 6 H PRF WHZ OR SOB   Yes [provider]  fluticasone-salmeterol (WIXELA INHUB) 250-50 MCG/ACT AEPB INHALE 1 PUFF INTO THE LUNGS IN THE MORNING AND AT BEDTIME FOR 60 DOSES 09/14/21  Yes Bacigalupo, Dionne Bucy, MD  montelukast (SINGULAIR) 10 MG tablet TAKE 1 TABLET(10 MG) BY MOUTH AT BEDTIME 01/11/22  Yes Bacigalupo, Dionne Bucy, MD  fexofenadine (ALLEGRA) 180 MG tablet Take 180 mg by mouth daily.    [provider]  ipratropium (ATROVENT) 0.06 % nasal spray Place 2 sprays into both nostrils 4 (four) times daily. 07/06/21   Margarette Canada, NP  valACYclovir (VALTREX) 1000 MG tablet Take 1g once daily for five days at first sign of outbreak. 12/07/20   Jerrol Banana., MD  ethynodiol-ethinyl estradiol (ZOVIA) 1-35 MG-MCG tablet Take 1 tablet by mouth daily. 05/18/20 10/31/20  Trinna Post, PA-C    Family History Family History  Problem Relation Age of Onset  Breast cancer Mother 85       myRisk neg   Multiple sclerosis Mother    Diabetes Maternal Grandfather    Hypertension Maternal Grandfather    Heart disease Maternal Grandfather    Diabetes Maternal Grandmother    Multiple sclerosis Maternal Uncle    Healthy Brother    Healthy Brother    Breast cancer Other 50    Social History Social History   Tobacco Use   Smoking status: Never   Smokeless tobacco: Never  Vaping Use   Vaping Use: Never used  Substance Use Topics   Alcohol use: Not Currently    Comment: socially   Drug use: No     Allergies   Amoxicillin   Review of Systems Review of Systems Defer to HPI    Physical Exam Triage Vital Signs ED Triage Vitals  Enc Vitals Group     BP 05/25/22 1445 112/75     Pulse Rate 05/25/22 1445 88      Resp 05/25/22 1445 14     Temp 05/25/22 1445 98.3 F (36.8 C)     Temp Source 05/25/22 1445 Oral     SpO2 05/25/22 1445 97 %     Weight 05/25/22 1442 160 lb (72.6 kg)     Height 05/25/22 1442 5' 4"  (1.626 m)     Head Circumference --      Peak Flow --      Pain Score 05/25/22 1442 9     Pain Loc --      Pain Edu? --      Excl. in [place]? --    No data found.  Updated Vital Signs BP 112/75 (BP Location: Right Arm)   Pulse 88   Temp 98.3 F (36.8 C) (Oral)   Resp 14   Ht 5' 4"  (1.626 m)   Wt 160 lb (72.6 kg)   LMP 05/23/2022 (Exact Date)   SpO2 97%   BMI 27.46 kg/m   Visual Acuity Right Eye Distance:   Left Eye Distance:   Bilateral Distance:    Right Eye Near:   Left Eye Near:    Bilateral Near:     Physical Exam Constitutional:      Appearance: Normal appearance. She is well-developed.  HENT:     Head: Normocephalic.     Right Ear: Tympanic membrane and ear canal normal.     Left Ear: Tympanic membrane and ear canal normal.     Nose: Rhinorrhea present. No congestion.     Mouth/Throat:     Mouth: Mucous membranes are moist.     Pharynx: Posterior oropharyngeal erythema present.     Tonsils: No tonsillar exudate. 0 on the right. 0 on the left.  Cardiovascular:     Rate and Rhythm: Normal rate and regular rhythm.     Pulses: Normal pulses.     Heart sounds: Normal heart sounds.  Pulmonary:     Effort: Pulmonary effort is normal.     Breath sounds: Normal breath sounds.  Abdominal:     General: Bowel sounds are normal.     Palpations: Abdomen is soft.  Musculoskeletal:     Cervical back: Normal range of motion and neck supple.  Skin:    General: Skin is warm and dry.  Neurological:     General: No focal deficit present.     Mental Status: She is alert and oriented to person, place, and time.  Psychiatric:        Mood and  Affect: Mood normal.        Behavior: Behavior normal.      UC Treatments / Results  Labs (all labs ordered are listed, but only  abnormal results are displayed) Labs Reviewed  GROUP A STREP BY PCR    EKG   Radiology No results found.  Procedures Procedures (including critical care time)  Medications Ordered in UC Medications - No data to display  Initial Impression / Assessment and Plan / UC Course  I have reviewed the triage vital signs and the nursing notes.  Pertinent labs & imaging results that were available during my care of the patient were reviewed by me and considered in my medical decision making (see chart for details).  Viral URI   Patient is in no signs of distress nor toxic appearing.  Vital signs are stable.  Low suspicion for pneumonia, pneumothorax or bronchitis and therefore will defer imaging.  Strep PCR negative, prescribed viscous lidocaine sore throat is most worrisome symptoms today  May use additional over-the-counter medications as needed for supportive care.  May follow-up with urgent care as needed if symptoms persist or worsen.  Final Clinical Impressions(s) / UC Diagnoses   Final diagnoses:  None   Discharge Instructions   None    ED Prescriptions   None    PDMP not reviewed this encounter.   Hans Eden, [place] 05/25/22 [phone]

## 2022-05-28 ENCOUNTER — Ambulatory Visit: Payer: BC Managed Care – PPO | Admitting: Family Medicine

## 2022-05-28 ENCOUNTER — Encounter: Payer: Self-pay | Admitting: Family Medicine

## 2022-05-28 VITALS — BP 118/84 | HR 78 | Temp 98.1°F | Resp 16 | Ht 64.0 in | Wt 167.0 lb

## 2022-05-28 DIAGNOSIS — R509 Fever, unspecified: Secondary | ICD-10-CM | POA: Diagnosis not present

## 2022-05-28 DIAGNOSIS — B379 Candidiasis, unspecified: Secondary | ICD-10-CM | POA: Insufficient documentation

## 2022-05-28 DIAGNOSIS — T3695XA Adverse effect of unspecified systemic antibiotic, initial encounter: Secondary | ICD-10-CM

## 2022-05-28 DIAGNOSIS — J0100 Acute maxillary sinusitis, unspecified: Secondary | ICD-10-CM | POA: Diagnosis not present

## 2022-05-28 LAB — POCT INFLUENZA A/B
Influenza A, POC: NEGATIVE
Influenza B, POC: NEGATIVE

## 2022-05-28 MED ORDER — FLUCONAZOLE 150 MG PO TABS: ORAL_TABLET | ORAL | 0 refills | Status: DC

## 2022-05-28 MED ORDER — AZITHROMYCIN 500 MG PO TABS: 500.0000 mg | ORAL_TABLET | Freq: Once | ORAL | 0 refills | Status: AC

## 2022-05-28 NOTE — Assessment & Plan Note (Signed)
Pt reports ABX rx yeast infection; request for anti-fungal medication to assist given use of Zithromycin for sinusitis

## 2022-05-28 NOTE — Progress Notes (Signed)
Established patient visit   Patient: Shelley Walton   DOB: 05/25/1996   26 y.o. Female  MRN: [id number] Visit Date: 05/28/2022  Today's healthcare provider: Gwyneth Sprout, FNP  Introduced to nurse practitioner role and practice setting.  All questions answered.  Discussed provider/patient relationship and expectations.  I,Tiffany J Bragg,acting as a scribe for Gwyneth Sprout, FNP.,have documented all relevant documentation on the behalf of Gwyneth Sprout, FNP,as directed by  Gwyneth Sprout, FNP while in the presence of Gwyneth Sprout, FNP.   Chief Complaint  Patient presents with   URI    Patient complains of sore throat, congestion, cough, fever, lightheadedness, chills for 4 days. Had negative strep and covid tests. Wants advair refilled.    Subjective    HPI HPI     URI    Additional comments: Patient complains of sore throat, congestion, cough, fever, lightheadedness, chills for 4 days. Had negative strep and covid tests. Wants advair refilled.       Last edited by Smitty Knudsen, CMA on 05/28/2022  1:49 PM.       Medications: Outpatient Medications Prior to Visit  Medication Sig   ADVAIR DISKUS 250-50 MCG/ACT AEPB INHALE 1 PUFF INTO THE LUNGS IN THE MORNING AND AT BEDTIME   albuterol (VENTOLIN HFA) 108 (90 Base) MCG/ACT inhaler INL 2 PFS ITL Q 6 H PRF WHZ OR SOB   fexofenadine (ALLEGRA) 180 MG tablet Take 180 mg by mouth daily.   ipratropium (ATROVENT) 0.06 % nasal spray Place 2 sprays into both nostrils 4 (four) times daily.   lidocaine (XYLOCAINE) 2 % solution Use as directed 15 mLs in the mouth or throat every 4 (four) hours as needed for mouth pain.   montelukast (SINGULAIR) 10 MG tablet TAKE 1 TABLET(10 MG) BY MOUTH AT BEDTIME   traMADol (ULTRAM) 50 MG tablet    valACYclovir (VALTREX) 1000 MG tablet Take 1g once daily for five days at first sign of outbreak.   [DISCONTINUED] fluconazole (DIFLUCAN) 150 MG tablet Take 150 mg by mouth once.   [DISCONTINUED]  nitrofurantoin, macrocrystal-monohydrate, (MACROBID) 100 MG capsule    No facility-administered medications prior to visit.    Review of Systems    Objective    BP 118/84 (BP Location: Left Arm, Patient Position: Sitting, Cuff Size: Normal)   Pulse 78   Temp 98.1 F (36.7 C) (Oral)   Resp 16   Ht '5\' 4"'$  (1.626 m)   Wt 167 lb (75.8 kg)   LMP 05/23/2022 (Exact Date)   SpO2 99%   BMI 28.67 kg/m   Physical Exam Vitals and nursing note reviewed.  Constitutional:      General: She is not in acute distress.    Appearance: Normal appearance. She is overweight. She is not ill-appearing, toxic-appearing or diaphoretic.  HENT:     Head: Normocephalic and atraumatic.     Right Ear: Tympanic membrane, ear canal and external ear normal. There is no impacted cerumen.     Left Ear: Tympanic membrane, ear canal and external ear normal. There is no impacted cerumen.     Nose: Congestion present. No rhinorrhea.     Mouth/Throat:     Mouth: Mucous membranes are moist.     Pharynx: Oropharynx is clear. Posterior oropharyngeal erythema present. No oropharyngeal exudate.  Eyes:     General:        Right eye: No discharge.        Left eye: No  discharge.     Extraocular Movements: Extraocular movements intact.     Pupils: Pupils are equal, round, and reactive to light.  Neck:     Comments: +adenopathy  Cardiovascular:     Rate and Rhythm: Normal rate and regular rhythm.     Pulses: Normal pulses.     Heart sounds: Normal heart sounds. No murmur heard.    No friction rub. No gallop.  Pulmonary:     Effort: Pulmonary effort is normal. No respiratory distress.     Breath sounds: Normal breath sounds. No stridor. No wheezing, rhonchi or rales.  Chest:     Chest wall: No tenderness.  Musculoskeletal:        General: No swelling, deformity or signs of injury. Normal range of motion.     Cervical back: Neck supple. Tenderness present.     Right lower leg: No edema.     Left lower leg: No  edema.  Skin:    General: Skin is warm and dry.     Capillary Refill: Capillary refill takes less than 2 seconds.     Coloration: Skin is not jaundiced or pale.     Findings: No bruising, erythema, lesion or rash.  Neurological:     General: No focal deficit present.     Mental Status: She is alert and oriented to person, place, and time. Mental status is at baseline.     Cranial Nerves: No cranial nerve deficit.     Sensory: No sensory deficit.     Motor: No weakness.     Coordination: Coordination normal.  Psychiatric:        Mood and Affect: Mood normal.        Behavior: Behavior normal.        Thought Content: Thought content normal.        Judgment: Judgment normal.      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 given hx of allergic rhinitis and asthma LCTAB; advair was refilled prior to OV      Relevant Medications   azithromycin (ZITHROMAX) 500 MG tablet   fluconazole (DIFLUCAN) 150 MG tablet     Other   Antibiotic-induced yeast infection    Pt reports ABX rx yeast infection; request for anti-fungal medication to assist given use of Zithromycin for sinusitis       Relevant Medications   azithromycin (ZITHROMAX) 500 MG tablet   fluconazole (DIFLUCAN) 150 MG tablet   Fever    - for strep at [place] x2 at home Works in a school; 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      Vonna Kotyk, FNP,  have reviewed all documentation for this visit. The documentation on 05/28/22 for the exam, diagnosis, procedures, and orders are all accurate and complete.  Gwyneth Sprout, FNP  [place] [phone] (phone) [phone] (fax)  [place]

## 2022-05-28 NOTE — Telephone Encounter (Signed)
Requested Prescriptions  Pending Prescriptions Disp Refills  . ADVAIR DISKUS 250-50 MCG/ACT AEPB [Pharmacy Med Name: ADVAIR DISKUS 250/50MCG (YELLOW) 60] 180 each 3    Sig: INHALE 1 PUFF INTO THE LUNGS IN THE MORNING AND AT BEDTIME     Pulmonology:  [employer] Passed - 05/25/2022  5:10 PM      Passed - Valid encounter within last 12 months    Recent Outpatient Visits          4 months ago Moderate persistent asthmatic bronchitis with acute exacerbation   [facility] Mikey Kirschner, PA-C   8 months ago Encounter for annual physical exam   [facility] [place], Dionne Bucy, MD   9 months ago Uncomplicated asthma, unspecified asthma severity, unspecified whether persistent   [employer], Dani Gobble, PA-C   1 year ago Acute non-recurrent frontal sinusitis   [facility] [place], [place], [place]   2 years ago [place], Wendee Beavers, [place]      Future Appointments            In 3 months Bacigalupo, Dionne Bucy, MD [facility], [place]

## 2022-05-28 NOTE — Assessment & Plan Note (Signed)
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

## 2022-05-28 NOTE — Telephone Encounter (Signed)
Duplicate request. Requested Prescriptions  Pending Prescriptions Disp Refills  . WIXELA INHUB 250-50 MCG/ACT AEPB [Pharmacy Med Name: WIXELA INHUB DISKUS 250/50MCG 60S] 60 each     Sig: INHALE 1 PUFF INTO THE LUNGS IN THE MORNING AND AT BEDTIME     Pulmonology:  Comb[employer] Products Passed - 05/25/2022  5:11 PM      Passed - Valid encounter within last 12 months    Recent Outpatient Visits          4 months ago Moderate persistent asthmatic bronchitis with acute exacerbation   [facility] Mikey Kirschner, PA-C   8 months ago Encounter for annual physical exam   [employer] [facility], Dionne Bucy, MD   9 months ago Uncomplicated asthma, unspecified asthma severity, unspecified whether persistent   [employer], Dani Gobble, PA-C   1 year ago Acute non-recurrent frontal sinusitis   [facility] [place], Wendee Beavers, [place]   2 years ago [place], Wendee Beavers, [place]      Future Appointments            In 3 months Bacigalupo, Dionne Bucy, MD [facility], [place]

## 2022-05-28 NOTE — Assessment & Plan Note (Signed)
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

## 2022-08-06 ENCOUNTER — Ambulatory Visit: Payer: BC Managed Care – PPO | Admitting: Family Medicine

## 2022-08-06 ENCOUNTER — Encounter: Payer: Self-pay | Admitting: Family Medicine

## 2022-08-06 VITALS — BP 111/74 | HR 90 | Temp 97.8°F | Wt 173.3 lb

## 2022-08-06 DIAGNOSIS — J4541 Moderate persistent asthma with (acute) exacerbation: Secondary | ICD-10-CM | POA: Diagnosis not present

## 2022-08-06 DIAGNOSIS — T3695XA Adverse effect of unspecified systemic antibiotic, initial encounter: Secondary | ICD-10-CM | POA: Diagnosis not present

## 2022-08-06 DIAGNOSIS — J329 Chronic sinusitis, unspecified: Secondary | ICD-10-CM | POA: Diagnosis not present

## 2022-08-06 DIAGNOSIS — J45909 Unspecified asthma, uncomplicated: Secondary | ICD-10-CM | POA: Insufficient documentation

## 2022-08-06 DIAGNOSIS — B379 Candidiasis, unspecified: Secondary | ICD-10-CM | POA: Diagnosis not present

## 2022-08-06 LAB — POCT INFLUENZA A/B
Influenza A, POC: NEGATIVE
Influenza B, POC: NEGATIVE

## 2022-08-06 MED ORDER — FLUCONAZOLE 150 MG PO TABS: ORAL_TABLET | ORAL | 0 refills | Status: DC

## 2022-08-06 MED ORDER — AZITHROMYCIN 500 MG PO TABS: 500.0000 mg | ORAL_TABLET | Freq: Every day | ORAL | 0 refills | Status: DC

## 2022-08-06 MED ORDER — PREDNISONE 50 MG PO TABS: 50.0000 mg | ORAL_TABLET | Freq: Every day | ORAL | 0 refills | Status: DC

## 2022-08-06 NOTE — Assessment & Plan Note (Signed)
Acute on chronic, wheezing at night, difficulty laying flat 50 mg prednisone x 5 days

## 2022-08-06 NOTE — Assessment & Plan Note (Signed)
Acute on chronic, symptoms for 2 weeks with current flare Last flare in October Low grade fever 99.56F improved with APAP otc Remains on all other medications Suggestion of change to Zyrtec/Claritin with addition of Flonase and use of decongestant given acute period Will repeat Abx given negative flu and covid

## 2022-08-06 NOTE — Progress Notes (Signed)
I,Tiffany J Bragg,acting as a scribe for Gwyneth Sprout, FNP.,have documented all relevant documentation on the behalf of Gwyneth Sprout, FNP,as directed by  Gwyneth Sprout, FNP while in the presence of Gwyneth Sprout, FNP.   Established patient visit   Patient: Shelley Walton   DOB: [birth year]   26 y.o. Female  MRN: [id number] Visit Date: 08/06/2022  Today's healthcare provider: Gwyneth Sprout, FNP re Introduced to nurse practitioner role and practice setting.  All questions answered.  Discussed provider/patient relationship and expectations.   Chief Complaint  Patient presents with   Facial Pain   Subjective    HPI  Upper respiratory symptoms She complains of, headache,nasal congestion,and sore throat with fever to  99.7 degrees Fahrenheit. Onset of symptoms was a few days ago and congestion and nasal congestion worsening.  Pt took at home covid test this morning and it was negative ---------------------------------------------------------------------------------------------------   Medications: Outpatient Medications Prior to Visit  Medication Sig   ADVAIR DISKUS 250-50 MCG/ACT AEPB INHALE 1 PUFF INTO THE LUNGS IN THE MORNING AND AT BEDTIME   albuterol (VENTOLIN HFA) 108 (90 Base) MCG/ACT inhaler INL 2 PFS ITL Q 6 H PRF WHZ OR SOB   fexofenadine (ALLEGRA) 180 MG tablet Take 180 mg by mouth daily.   montelukast (SINGULAIR) 10 MG tablet TAKE 1 TABLET(10 MG) BY MOUTH AT BEDTIME   valACYclovir (VALTREX) 1000 MG tablet Take 1g once daily for five days at first sign of outbreak.   [DISCONTINUED] fluconazole (DIFLUCAN) 150 MG tablet Take 1 tablet for antibiotic related UTI; repeat if symptoms remain after 4 days   ipratropium (ATROVENT) 0.06 % nasal spray Place 2 sprays into both nostrils 4 (four) times daily. (Patient not taking: Reported on 08/06/2022)   lidocaine (XYLOCAINE) 2 % solution Use as directed 15 mLs in the mouth or throat every 4 (four) hours as needed for mouth pain.  (Patient not taking: Reported on 08/06/2022)   traMADol (ULTRAM) 50 MG tablet  (Patient not taking: Reported on 08/06/2022)   No facility-administered medications prior to visit.    Review of Systems    Objective    BP 111/74 (BP Location: Right Arm, Patient Position: Sitting, Cuff Size: Normal)   Pulse 90   Temp 97.8 F (36.6 C) (Oral)   Wt 173 lb 4.8 oz (78.6 kg)   SpO2 99%   BMI 29.75 kg/m   Physical Exam Vitals and nursing note reviewed.  Constitutional:      General: She is not in acute distress.    Appearance: Normal appearance. She is overweight. She is not ill-appearing, toxic-appearing or diaphoretic.  HENT:     Head: Normocephalic and atraumatic.     Right Ear: Swelling and tenderness present.     Left Ear: Swelling and tenderness present.     Nose:     Right Sinus: Maxillary sinus tenderness and frontal sinus tenderness present.     Left Sinus: Maxillary sinus tenderness and frontal sinus tenderness present.  Eyes:     General: Lids are normal. Vision grossly intact. No allergic shiner.    Conjunctiva/sclera: Conjunctivae normal.  Cardiovascular:     Rate and Rhythm: Normal rate and regular rhythm.     Pulses: Normal pulses.     Heart sounds: Normal heart sounds. No murmur heard.    No friction rub. No gallop.  Pulmonary:     Effort: Pulmonary effort is normal. No respiratory distress.     Breath sounds: Normal breath sounds. No  stridor. No wheezing, rhonchi or rales.  Chest:     Chest wall: No tenderness.  Abdominal:     General: Bowel sounds are normal.     Palpations: Abdomen is soft.  Musculoskeletal:        General: No swelling, tenderness, deformity or signs of injury. Normal range of motion.     Cervical back: Full passive range of motion without pain, normal range of motion and neck supple.     Right lower leg: No edema.     Left lower leg: No edema.  Skin:    General: Skin is warm and dry.     Capillary Refill: Capillary refill takes less than  2 seconds.     Coloration: Skin is not jaundiced or pale.     Findings: No bruising, erythema, lesion or rash.  Neurological:     General: No focal deficit present.     Mental Status: She is alert and oriented to person, place, and time. Mental status is at baseline.     Cranial Nerves: No cranial nerve deficit.     Sensory: No sensory deficit.     Motor: No weakness.     Coordination: Coordination normal.  Psychiatric:        Mood and Affect: Mood normal.        Behavior: Behavior normal.        Thought Content: Thought content normal.        Judgment: Judgment normal.      Results for orders placed or performed in visit on 08/06/22  POCT Influenza A/B  Result Value Ref Range   Influenza A, POC Negative Negative   Influenza B, POC Negative Negative    Assessment & Plan     Problem List Items Addressed This Visit       Respiratory   Asthmatic bronchitis    Acute on chronic, wheezing at night, difficulty laying flat 50 mg prednisone x 5 days      Relevant Medications   predniSONE (DELTASONE) 50 MG tablet   Recurrent sinusitis - Primary    Acute on chronic, symptoms for 2 weeks with current flare Last flare in October Low grade fever 99.67F improved with APAP otc Remains on all other medications Suggestion of change to Zyrtec/Claritin with addition of Flonase and use of decongestant given acute period Will repeat Abx given negative flu and covid       Relevant Medications   predniSONE (DELTASONE) 50 MG tablet   azithromycin (ZITHROMAX) 500 MG tablet   fluconazole (DIFLUCAN) 150 MG tablet   Other Relevant Orders   POCT Influenza A/B (Completed)     Other   Antibiotic-induced yeast infection   Relevant Medications   azithromycin (ZITHROMAX) 500 MG tablet   fluconazole (DIFLUCAN) 150 MG tablet   Return if symptoms worsen or fail to improve.    Vonna Kotyk, FNP, have reviewed all documentation for this visit. The documentation on 08/06/22 for the exam,  diagnosis, procedures, and orders are all accurate and complete.  Gwyneth Sprout, [place] [phone] (phone) [phone] (fax)  [place]

## 2022-09-03 ENCOUNTER — Other Ambulatory Visit: Payer: Self-pay | Admitting: Family Medicine

## 2022-09-03 ENCOUNTER — Telehealth: Payer: Self-pay | Admitting: Family Medicine

## 2022-09-03 MED ORDER — FLUTICASONE-SALMETEROL 250-50 MCG/ACT IN AEPB: 1.0000 | INHALATION_SPRAY | Freq: Two times a day (BID) | RESPIRATORY_TRACT | 5 refills | Status: DC

## 2022-09-03 NOTE — Progress Notes (Signed)
Notie from [employer] that Advair is not covered. Will switch to [employer].

## 2022-09-06 NOTE — Telephone Encounter (Signed)
Opened in error

## 2022-09-17 ENCOUNTER — Encounter: Payer: Self-pay | Admitting: Family Medicine

## 2022-09-17 ENCOUNTER — Ambulatory Visit (INDEPENDENT_AMBULATORY_CARE_PROVIDER_SITE_OTHER): Payer: BC Managed Care – PPO | Admitting: Family Medicine

## 2022-09-17 VITALS — BP 114/77 | HR 81 | Temp 98.1°F | Resp 16 | Ht 64.0 in | Wt 176.3 lb

## 2022-09-17 DIAGNOSIS — R29818 Other symptoms and signs involving the nervous system: Secondary | ICD-10-CM | POA: Diagnosis not present

## 2022-09-17 DIAGNOSIS — Z0000 Encounter for general adult medical examination without abnormal findings: Secondary | ICD-10-CM

## 2022-09-17 DIAGNOSIS — Z820 Family history of epilepsy and other diseases of the nervous system: Secondary | ICD-10-CM | POA: Diagnosis not present

## 2022-09-17 DIAGNOSIS — R42 Dizziness and giddiness: Secondary | ICD-10-CM

## 2022-09-17 NOTE — Progress Notes (Signed)
Shelley Walton,acting as a Education administrator for Lavon Paganini, MD.,have documented all relevant documentation on the behalf of Lavon Paganini, MD,as directed by  Lavon Paganini, MD while in the presence of Lavon Paganini, MD.    Complete physical exam   Patient: Shelley Walton   DOB: July 12, 1996   27 y.o. Female  MRN: [id number] Visit Date: 09/17/2022  Today's healthcare provider: Lavon Paganini, MD   Chief Complaint  Patient presents with   Annual Exam   Subjective    Shelley Walton is a 27 y.o. female who presents today for a complete physical exam.  She reports consuming a general diet. Home exercise routine includes bike. She generally feels well. She reports sleeping fairly well. She does not have additional problems to discuss today.  HPI  Patient declined HPV vaccine and Tdap. Declines STD screening  Dizzy spells randomly throughout the day for several months.  Intermittent numbness on one side of the body or in the feet. History of MS in her mom's family. Also admits to a lot of stress.  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 Donne Hazel   PONV (postoperative nausea and vomiting)    Past Surgical History:  Procedure Laterality Date   MASS EXCISION Right 10/14/2013   Procedure: RIGHT BREAST MASS EXCISION;  Surgeon: Rolm Bookbinder, MD;  Location: [place];  Service: General;  Laterality: Right;   RADIOACTIVE SEED GUIDED EXCISIONAL BREAST BIOPSY Left 07/16/2019   Procedure: RADIOACTIVE SEED GUIDED EXCISIONAL LEFT BREAST BIOPSY;  Surgeon: Rolm Bookbinder, MD;  Location: [place];  Service: General;  Laterality: Left;   WISDOM TOOTH EXTRACTION     Social History   Socioeconomic History   Marital status: Married    Spouse name: Not on  file   Number of children: Not on file   Years of education: Not on file   Highest education level: Not on file  Occupational History   Occupation: student    Comment: Planning to go to [employer] in fall for elementary educatoin.    Occupation: works at [employer]   Smoking status: Never   Smokeless tobacco: Never  Vaping Use   Vaping Use: Never used  Substance and Sexual Activity   Alcohol use: Not Currently    Comment: socially   Drug use: No   Sexual activity: Yes    Birth control/protection: None  Other Topics Concern   Not on file  Social History Narrative   Not on file   Social Determinants of Health   Financial Resource Strain: Not on file  Food Insecurity: Not on file  Transportation Needs: Not on file  Physical Activity: Not on file  Stress: Not on file  Social Connections: Not on file  Intimate Partner Violence: Not on file   Family Status  Relation Name Status   Mother  Alive   MGF  Alive   Father  Alive   MGM  (Not Specified)   Administrator  (Not Specified)   Brother  Alive   Brother  Alive   Other [place] Alive   Family History  Problem Relation Age of Onset   Breast cancer Mother 34       myRisk neg  Multiple sclerosis Mother    Diabetes Maternal Grandfather    Hypertension Maternal Grandfather    Heart disease Maternal Grandfather    Diabetes Maternal Grandmother    Multiple sclerosis Maternal Uncle    Healthy Brother    Healthy Brother    Breast cancer Other 58   Allergies  Allergen Reactions   Amoxicillin Nausea And Vomiting    Patient Care Team: Virginia Crews, MD as PCP - General (Family Medicine)   Medications: Outpatient Medications Prior to Visit  Medication Sig   albuterol (VENTOLIN HFA) 108 (90 Base) MCG/ACT inhaler INL 2 PFS ITL Q 6 H PRF WHZ OR SOB   fexofenadine (ALLEGRA) 180 MG tablet Take 180 mg by mouth daily.   fluconazole (DIFLUCAN) 150 MG tablet Take 1 tablet for antibiotic related UTI; repeat if symptoms  remain after 4 days   fluticasone-salmeterol (WIXELA INHUB) 250-50 MCG/ACT AEPB Inhale 1 puff into the lungs in the morning and at bedtime.   montelukast (SINGULAIR) 10 MG tablet TAKE 1 TABLET(10 MG) BY MOUTH AT BEDTIME   valACYclovir (VALTREX) 1000 MG tablet Take 1g once daily for five days at first sign of outbreak.   [DISCONTINUED] azithromycin (ZITHROMAX) 500 MG tablet Take 1 tablet (500 mg total) by mouth daily. (Patient not taking: Reported on 09/17/2022)   [DISCONTINUED] ipratropium (ATROVENT) 0.06 % nasal spray Place 2 sprays into both nostrils 4 (four) times daily. (Patient not taking: Reported on 09/17/2022)   [DISCONTINUED] lidocaine (XYLOCAINE) 2 % solution Use as directed 15 mLs in the mouth or throat every 4 (four) hours as needed for mouth pain. (Patient not taking: Reported on 09/17/2022)   [DISCONTINUED] predniSONE (DELTASONE) 50 MG tablet Take 1 tablet (50 mg total) by mouth daily with breakfast. Take with morning meal. (Patient not taking: Reported on 09/17/2022)   [DISCONTINUED] traMADol (ULTRAM) 50 MG tablet  (Patient not taking: Reported on 09/17/2022)   No facility-administered medications prior to visit.    Review of Systems  Neurological:  Positive for dizziness, light-headedness, numbness and headaches.  All other systems reviewed and are negative.     Objective    BP 114/77 (BP Location: Left Arm, Patient Position: Sitting, Cuff Size: Large)   Pulse 81   Temp 98.1 F (36.7 C) (Temporal)   Resp 16   Ht '5\' 4"'$  (1.626 m)   Wt 176 lb 4.8 oz (80 kg)   LMP 08/23/2022 (Exact Date)   BMI 30.26 kg/m  BP Readings from Last 3 Encounters:  09/17/22 114/77  08/06/22 111/74  05/28/22 118/84   Wt Readings from Last 3 Encounters:  09/17/22 176 lb 4.8 oz (80 kg)  08/06/22 173 lb 4.8 oz (78.6 kg)  05/28/22 167 lb (75.8 kg)      Physical Exam Vitals reviewed.  Constitutional:      General: She is not in acute distress.    Appearance: Normal appearance. She is  well-developed. She is not diaphoretic.  HENT:     Head: Normocephalic and atraumatic.     Right Ear: Tympanic membrane, ear canal and external ear normal.     Left Ear: Tympanic membrane, ear canal and external ear normal.     Nose: Nose normal.     Mouth/Throat:     Mouth: Mucous membranes are moist.     Pharynx: Oropharynx is clear. No oropharyngeal exudate.  Eyes:     General: No scleral icterus.    Conjunctiva/sclera: Conjunctivae normal.     Pupils: Pupils are equal, round, and  reactive to light.  Neck:     Thyroid: No thyromegaly.  Cardiovascular:     Rate and Rhythm: Normal rate and regular rhythm.     Pulses: Normal pulses.     Heart sounds: Normal heart sounds. No murmur heard. Pulmonary:     Effort: Pulmonary effort is normal. No respiratory distress.     Breath sounds: Normal breath sounds. No wheezing or rales.  Abdominal:     General: There is no distension.     Palpations: Abdomen is soft.     Tenderness: There is no abdominal tenderness.  Musculoskeletal:        General: No deformity.     Cervical back: Neck supple.     Right lower leg: No edema.     Left lower leg: No edema.  Lymphadenopathy:     Cervical: No cervical adenopathy.  Skin:    General: Skin is warm and dry.     Findings: No rash.  Neurological:     General: No focal deficit present.     Mental Status: She is alert and oriented to person, place, and time. Mental status is at baseline.     Cranial Nerves: No cranial nerve deficit.     Sensory: No sensory deficit.     Motor: No weakness.     Coordination: Coordination normal.     Gait: Gait normal.  Psychiatric:        Mood and Affect: Mood normal.        Behavior: Behavior normal.        Thought Content: Thought content normal.       Last depression screening scores    09/17/2022    3:17 PM 08/06/2022    1:33 PM 05/28/2022    1:52 PM  PHQ 2/9 Scores  PHQ - 2 Score 0 0 0  PHQ- 9 Score '2 2 4   '$ Last fall risk screening     09/17/2022    3:16 PM  [place] in the past year? 0  Number falls in past yr: 0  Injury with Fall? 0  Risk for fall due to : No Fall Risks  Follow up Falls evaluation completed   Last Audit-C alcohol use screening    09/17/2022    3:17 PM  Alcohol Use Disorder Test (AUDIT)  1. How often do you have a drink containing alcohol? 2  2. How many drinks containing alcohol do you have on a typical day when you are drinking? 0  3. How often do you have six or more drinks on one occasion? 0  AUDIT-C Score 2   A score of 3 or more in women, and 4 or more in men indicates increased risk for alcohol abuse, EXCEPT if all of the points are from question 1   No results found for any visits on 09/17/22.  Assessment & Plan    Routine Health Maintenance and Physical Exam  Exercise Activities and Dietary recommendations  Goals   None      There is no immunization history on file for this patient.  Health Maintenance  Topic Date Due   HPV VACCINES (1 - 2-dose series) Never done   DTaP/Tdap/Td (1 - Tdap) Never done   INFLUENZA VACCINE  11/25/2022 (Originally 03/27/2022)   PAP-Cervical Cytology Screening  02/08/2024   PAP SMEAR-Modifier  02/08/2024   Hepatitis C Screening  Completed   HIV Screening  Completed    Discussed health benefits of physical activity, and  encouraged her to engage in regular exercise appropriate for her age and condition.  Problem List Items Addressed This Visit       Other   Transient neurological symptoms    New transient neuro symtpoms, including dizziness (not vertigo, normal exam) and numbness With a family history of MS, warrants Neuro eval Referral placed today      Relevant Orders   Ambulatory referral to Neurology   Other Visit Diagnoses     Encounter for annual physical exam    -  Primary   Family history of MS (multiple sclerosis)       Relevant Orders   Ambulatory referral to Neurology   Dizziness            Return in about 1  year (around 09/18/2023) for CPE.     I, Lavon Paganini, MD, have reviewed all documentation for this visit. The documentation on 09/17/22 for the exam, diagnosis, procedures, and orders are all accurate and complete.   Elleana Stillson, Dionne Bucy, MD, MPH [facility]

## 2022-09-17 NOTE — Assessment & Plan Note (Signed)
New transient neuro symtpoms, including dizziness (not vertigo, normal exam) and numbness With a family history of MS, warrants Neuro eval Referral placed today

## 2022-10-10 ENCOUNTER — Other Ambulatory Visit: Payer: Self-pay | Admitting: Family Medicine

## 2022-10-10 ENCOUNTER — Telehealth: Payer: BC Managed Care – PPO | Admitting: Physician Assistant

## 2022-10-10 DIAGNOSIS — R3989 Other symptoms and signs involving the genitourinary system: Secondary | ICD-10-CM | POA: Diagnosis not present

## 2022-10-10 DIAGNOSIS — B379 Candidiasis, unspecified: Secondary | ICD-10-CM

## 2022-10-10 MED ORDER — NITROFURANTOIN MONOHYD MACRO 100 MG PO CAPS: 100.0000 mg | ORAL_CAPSULE | Freq: Two times a day (BID) | ORAL | 0 refills | Status: DC

## 2022-10-10 NOTE — Progress Notes (Signed)
I have spent 5 minutes in review of e-visit questionnaire, review and updating patient chart, medical decision making and response to patient.   Edoardo Laforte Cody Estuardo Frisbee, PA-C    

## 2022-10-10 NOTE — Progress Notes (Signed)

## 2022-10-10 NOTE — Telephone Encounter (Signed)
Please advise 

## 2022-10-15 ENCOUNTER — Ambulatory Visit: Payer: Self-pay

## 2022-10-15 ENCOUNTER — Telehealth: Payer: BC Managed Care – PPO | Admitting: Physician Assistant

## 2022-10-15 ENCOUNTER — Other Ambulatory Visit: Payer: Self-pay

## 2022-10-15 ENCOUNTER — Ambulatory Visit
Admission: EM | Admit: 2022-10-15 | Discharge: 2022-10-15 | Disposition: A | Discharge status: Home / Self Care | Payer: BC Managed Care – PPO | Attending: Emergency Medicine | Admitting: Emergency Medicine

## 2022-10-15 DIAGNOSIS — J029 Acute pharyngitis, unspecified: Secondary | ICD-10-CM

## 2022-10-15 DIAGNOSIS — J020 Streptococcal pharyngitis: Secondary | ICD-10-CM | POA: Diagnosis not present

## 2022-10-15 DIAGNOSIS — Z1152 Encounter for screening for COVID-19: Secondary | ICD-10-CM | POA: Insufficient documentation

## 2022-10-15 DIAGNOSIS — B379 Candidiasis, unspecified: Secondary | ICD-10-CM

## 2022-10-15 DIAGNOSIS — J069 Acute upper respiratory infection, unspecified: Secondary | ICD-10-CM

## 2022-10-15 LAB — GROUP A STREP BY PCR: Group A Strep by PCR: DETECTED — AB

## 2022-10-15 LAB — RAPID INFLUENZA A&B ANTIGENS
Influenza A (ARMC): NEGATIVE
Influenza B (ARMC): NEGATIVE

## 2022-10-15 LAB — SARS CORONAVIRUS 2 BY RT PCR: SARS Coronavirus 2 by RT PCR: NEGATIVE

## 2022-10-15 MED ORDER — AZITHROMYCIN 250 MG PO TABS: 250.0000 mg | ORAL_TABLET | Freq: Every day | ORAL | 0 refills | Status: DC

## 2022-10-15 MED ORDER — FLUCONAZOLE 150 MG PO TABS: ORAL_TABLET | ORAL | 0 refills | Status: DC

## 2022-10-15 MED ORDER — IPRATROPIUM BROMIDE 0.06 % NA SOLN: 2.0000 | Freq: Four times a day (QID) | NASAL | 12 refills | Status: DC

## 2022-10-15 NOTE — Telephone Encounter (Signed)
Message from [employer] sent at 10/15/2022 11:33 AM EST  Summary: sore throat   The patient has been in close contact with someone that has had strep throat on 10/12/22  The patient has experienced a sore throat, ear discomfort and congestion  The patient has tried otc medication with no effect  The patient would like to be contacted by a member of staff when possible          Chief Complaint: mod to severe sore throat  Symptoms: earache Frequency: yesterday am  Pertinent Negatives: Patient denies SOB, rash, Headache Disposition: []$ ED /[x]$ Urgent Care (no appt availability in office) / []$ Appointment(In office/virtual)/ []$  [facility]/ []$ Home Care/ []$ Refused Recommended Disposition /[]$  Mobile Bus/ []$  Follow-up with PCP Additional Notes: was going to do virtual but both pt and this NT if would treat for strep without testing. Refused Tuesday appt.   Reason for Disposition  Earache also present  Answer Assessment - Initial Assessment Questions 1. ONSET: "When did the throat start hurting?" (Hours or days ago)      Yesterday am 2. SEVERITY: "How bad is the sore throat?" (Scale 1-10; mild, moderate or severe)   - MILD (1-3):  Doesn't interfere with eating or normal activities.   - MODERATE (4-7): Interferes with eating some solids and normal activities.   - SEVERE (8-10):  Excruciating pain, interferes with most normal activities.   - SEVERE WITH DYSPHAGIA (10): Can't swallow liquids, drooling.     moderate 3. STREP EXPOSURE: "Has there been any exposure to strep within the past week?" If Yes, ask: "What type of contact occurred?"      yes 4.  VIRAL SYMPTOMS: "Are there any symptoms of a cold, such as a runny nose, cough, hoarse voice or red eyes?"      Ear ache (both), sore lymph node neck, sinus congestion 5. FEVER: "Do you have a fever?" If Yes, ask: "What is your temperature, how was it measured, and when did it start?"     no 6. PUS ON THE TONSILS:  "Is there pus on the tonsils in the back of your throat?"     no 7. OTHER SYMPTOMS: "Do you have any other symptoms?" (e.g., difficulty breathing, headache, rash)     earache 8. PREGNANCY: "Is there any chance you are pregnant?" "When was your last menstrual period?"     *No Answer*  Protocols used: Sore Throat-A-AH

## 2022-10-15 NOTE — Progress Notes (Signed)
I have spent 5 minutes in review of e-visit questionnaire, review and updating patient chart, medical decision making and response to patient.   Niamh Rada Cody Sharifa Bucholz, PA-C    

## 2022-10-15 NOTE — Progress Notes (Signed)
Message sent to patient requesting further input regarding current symptoms. Awaiting patient response.  

## 2022-10-15 NOTE — ED Triage Notes (Signed)
Was exposed to someone who was strep positive on Saturday and yesterday stated experiencing sore throat and low grade fever of 99

## 2022-10-15 NOTE — ED Provider Notes (Addendum)
[facility]    CSN: [id number] Arrival date & time: 10/15/22  1328      History   Chief Complaint Chief Complaint  Patient presents with   Sore Throat    moderate to severe pain, sinus congestion, strep throat exposure - Entered by patient    HPI Shelley Walton is a 27 y.o. female.   HPI  27 year old female here for evaluation respiratory complaints.  The patient reports that 2 days ago she was exposed to someone who is strep positive and then yesterday she started to have a sore throat and low-grade fever of 99 at home.  She is also endorsing ear pain, runny nose, nasal congestion, and swollen lymph nodes in her neck.  She denies any cough.  She has had some nausea but no vomiting or diarrhea.  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   Acne 02/24/2015   Asthma 02/24/2015    Past Surgical History:  Procedure Laterality Date   MASS EXCISION Right 10/14/2013   Procedure: RIGHT BREAST MASS EXCISION;  Surgeon: Rolm Bookbinder, MD;  Location: [place];  Service: General;  Laterality: Right;   RADIOACTIVE SEED GUIDED EXCISIONAL BREAST BIOPSY Left 07/16/2019   Procedure: RADIOACTIVE SEED GUIDED EXCISIONAL LEFT BREAST BIOPSY;  Surgeon: Rolm Bookbinder, MD;  Location: [place];  Service: General;  Laterality: Left;   WISDOM TOOTH EXTRACTION      OB History     Gravida  1   Para  0   Term  0   Preterm  0   AB  0   Living  0      SAB  0   IAB  0   Ectopic  0   Multiple  0    Live Births  0            Home Medications    Prior to Admission medications   Medication Sig Start Date End Date Taking? Authorizing Provider  azithromycin (ZITHROMAX Z-PAK) 250 MG tablet Take 1 tablet (250 mg total) by mouth daily. Take 2 tablets on the first day and then 1 tablet daily thereafter for a total of 5 days of treatment. 10/15/22  Yes Margarette Canada, NP  ipratropium (ATROVENT) 0.06 % nasal spray Place 2 sprays into both nostrils 4 (four) times daily. 10/15/22  Yes Margarette Canada, NP  albuterol (VENTOLIN HFA) 108 (90 Base) MCG/ACT inhaler INL 2 PFS ITL Q 6 H PRF WHZ OR SOB    [provider]  fexofenadine (ALLEGRA) 180 MG tablet Take 180 mg by mouth daily.    [provider]  fluconazole (DIFLUCAN) 150 MG tablet TAKE 1 TABLET BY MOUTH ONCE FOR ANTIBIOTIC RELATED URINARY TRACT INFECTION, IF SYMPTOMS REMAIN 4 DAYS LATER REPEAT DOSE 10/15/22   Margarette Canada, NP  fluticasone-salmeterol [facility] INHUB) 250-50 MCG/ACT AEPB Inhale 1 puff into the lungs in the morning and at bedtime. 09/03/22   Virginia Crews, MD  montelukast (SINGULAIR) 10 MG  tablet TAKE 1 TABLET(10 MG) BY MOUTH AT BEDTIME 01/11/22   Bacigalupo, Dionne Bucy, MD  nitrofurantoin, macrocrystal-monohydrate, (MACROBID) 100 MG capsule Take 1 capsule (100 mg total) by mouth 2 (two) times daily. 10/10/22   Brunetta Jeans, PA-C  valACYclovir (VALTREX) 1000 MG tablet Take 1g once daily for five days at first sign of outbreak. 12/07/20   Eulas Post, MD  ethynodiol-ethinyl estradiol (ZOVIA) 1-35 MG-MCG tablet Take 1 tablet by mouth daily. 05/18/20 10/31/20  Trinna Post, PA-C    Family History Family History  Problem Relation Age of Onset   Breast cancer Mother 15       myRisk neg   Multiple sclerosis Mother    Diabetes Maternal Grandfather    Hypertension Maternal Grandfather    Heart disease Maternal Grandfather    Diabetes Maternal Grandmother    Multiple sclerosis Maternal Uncle    Healthy  Brother    Healthy Brother    Breast cancer Other 38    Social History Social History   Tobacco Use   Smoking status: Never   Smokeless tobacco: Never  Vaping Use   Vaping Use: Never used  Substance Use Topics   Alcohol use: Not Currently    Comment: socially   Drug use: No     Allergies   Amoxicillin   Review of Systems Review of Systems  Constitutional:  Positive for fever.  HENT:  Positive for congestion, ear pain, rhinorrhea and sore throat.   Respiratory:  Negative for cough.   Gastrointestinal:  Positive for nausea. Negative for diarrhea and vomiting.  Hematological: Negative.   Psychiatric/Behavioral: Negative.       Physical Exam Triage Vital Signs ED Triage Vitals [10/15/22 1430]  Enc Vitals Group     BP      Pulse      Resp      Temp      Temp src      SpO2      Weight      Height      Head Circumference      Peak Flow      Pain Score 7     Pain Loc      Pain Edu?      Excl. in [place]?    No data found.  Updated Vital Signs BP 121/79   Pulse 87   Temp 98.3 F (36.8 C)   Resp 20   LMP 09/18/2022 (Approximate)   SpO2 100%   Visual Acuity Right Eye Distance:   Left Eye Distance:   Bilateral Distance:    Right Eye Near:   Left Eye Near:    Bilateral Near:     Physical Exam Vitals and nursing note reviewed.  Constitutional:      Appearance: Normal appearance. She is not ill-appearing.  HENT:     Head: Normocephalic and atraumatic.     Right Ear: Tympanic membrane, ear canal and external ear normal. There is no impacted cerumen.     Left Ear: Tympanic membrane, ear canal and external ear normal. There is no impacted cerumen.     Nose: Congestion and rhinorrhea present.     Comments: Nasal mucosa is erythematous and edematous and there is a mixture of bloody yellow discharge in both nares.    Mouth/Throat:     Mouth: Mucous membranes are moist.     Pharynx: Oropharynx is clear. Posterior oropharyngeal erythema present. No  oropharyngeal exudate.     Comments: Bilateral tonsillar pillars  are erythematous and injected but no appreciable exudate. Neck:     Comments: Bilateral anterior cervical lymphadenopathy present on exam. Cardiovascular:     Rate and Rhythm: Normal rate and regular rhythm.     Pulses: Normal pulses.     Heart sounds: Normal heart sounds. No murmur heard.    No friction rub. No gallop.  Pulmonary:     Effort: Pulmonary effort is normal.     Breath sounds: Normal breath sounds. No wheezing, rhonchi or rales.  Musculoskeletal:     Cervical back: Normal range of motion and neck supple. Tenderness present.  Lymphadenopathy:     Cervical: Cervical adenopathy present.  Skin:    General: Skin is warm and dry.     Capillary Refill: Capillary refill takes less than 2 seconds.     Findings: No erythema or rash.  Neurological:     General: No focal deficit present.     Mental Status: She is alert and oriented to person, place, and time.      UC Treatments / Results  Labs (all labs ordered are listed, but only abnormal results are displayed) Labs Reviewed  GROUP A STREP BY PCR - Abnormal; Notable for the following components:      Result Value   Group A Strep by PCR DETECTED (*)    All other components within normal limits  SARS CORONAVIRUS 2 BY RT PCR  RAPID INFLUENZA A&B ANTIGENS    EKG   Radiology No results found.  Procedures Procedures (including critical care time)  Medications Ordered in UC Medications - No data to display  Initial Impression / Assessment and Plan / UC Course  I have reviewed the triage vital signs and the nursing notes.  Pertinent labs & imaging results that were available during my care of the patient were reviewed by me and considered in my medical decision making (see chart for details).   Patient is a nontoxic-appearing 27 year old female who was recently exposed to strep and then developed a sore throat the following day after exposure.  She  also has runny nose, nasal congestion, and bilateral ear pain.  On exam she does have inflamed nasal mucosa with bloody yellow discharge in both nares and she has inflamed tonsillar pillars as well as anterior cervical lymphadenopathy.  Given her strep exposure I will order a strep PCR but I am also going to order a COVID PCR and influenza antigen test given her other upper respiratory symptoms which are not consistent with strep throat.  Strep PCR is positive.  Influenza antigen test is negative for A and B.  COVID PCR is negative.  I will discharge patient with diagnosis strep pharyngitis and upper respiratory infection.  I will treat her with azithromycin for strep throat as she has nausea and vomiting from amoxicillin.  I will also provide Atrovent nasal spray to help her with her nasal congestion.  Tylenol and ibuprofen as needed for pain and fever.  Work note provided.  The patient has been prescribed Diflucan tablets in the past for antibiotic related yeast infection.  I will refill her Diflucan prescription.   Final Clinical Impressions(s) / UC Diagnoses   Final diagnoses:  Strep pharyngitis  Upper respiratory tract infection, unspecified type     Discharge Instructions      Take the Azithromycin daily for 5 days for treatment of your strep throat.  Gargle with warm salt water 2-3 times a day to soothe your throat, aid in pain relief, and aid  in healing.  Take over-the-counter ibuprofen according to the package instructions as needed for pain.  You can also use Chloraseptic or Sucrets lozenges, 1 lozenge every 2 hours as needed for throat pain.  Use the Atrovent nasal spray, 2 squirts up each nostril every 6 hours, as needed for nasal congestion.  If you develop symptoms of a yeast infection take 1 Diflucan tablet now and you may repeat in 4 days if your symptoms continue.  If you develop any new or worsening symptoms return for reevaluation.      ED Prescriptions      Medication Sig Dispense Auth. Provider   azithromycin (ZITHROMAX Z-PAK) 250 MG tablet Take 1 tablet (250 mg total) by mouth daily. Take 2 tablets on the first day and then 1 tablet daily thereafter for a total of 5 days of treatment. 6 tablet Margarette Canada, NP   ipratropium (ATROVENT) 0.06 % nasal spray Place 2 sprays into both nostrils 4 (four) times daily. 15 mL Margarette Canada, NP   fluconazole (DIFLUCAN) 150 MG tablet TAKE 1 TABLET BY MOUTH ONCE FOR ANTIBIOTIC RELATED URINARY TRACT INFECTION, IF SYMPTOMS REMAIN 4 DAYS LATER REPEAT DOSE 2 tablet Margarette Canada, NP      PDMP not reviewed this encounter.   Margarette Canada, NP 10/15/22 1532    Margarette Canada, NP 10/15/22 [phone]

## 2022-10-15 NOTE — Discharge Instructions (Addendum)
Take the Azithromycin daily for 5 days for treatment of your strep throat.  Gargle with warm salt water 2-3 times a day to soothe your throat, aid in pain relief, and aid in healing.  Take over-the-counter ibuprofen according to the package instructions as needed for pain.  You can also use Chloraseptic or Sucrets lozenges, 1 lozenge every 2 hours as needed for throat pain.  Use the Atrovent nasal spray, 2 squirts up each nostril every 6 hours, as needed for nasal congestion.  If you develop symptoms of a yeast infection take 1 Diflucan tablet now and you may repeat in 4 days if your symptoms continue.  If you develop any new or worsening symptoms return for reevaluation.

## 2022-10-15 NOTE — Progress Notes (Signed)
  E-Visit for Sore Throat  We are sorry that you are not feeling well.  Here is how we plan to help!  Your symptoms indicate a likely viral infection (Pharyngitis).   Pharyngitis is inflammation in the back of the throat which can cause a sore throat, scratchiness and sometimes difficulty swallowing.   Pharyngitis is typically caused by a respiratory virus and will just run its course.  Please keep in mind that your symptoms could last up to 10 days.  For throat pain, we recommend over the counter oral pain relief medications such as acetaminophen or aspirin, or anti-inflammatory medications such as ibuprofen or naproxen sodium.  Topical treatments such as oral throat lozenges or sprays may be used as needed.  Avoid close contact with loved ones, especially the very young and elderly.  Remember to wash your hands thoroughly throughout the day as this is the number one way to prevent the spread of infection and wipe down door knobs and counters with disinfectant.  After careful review of your answers, I would not recommend an antibiotic for your condition.  Antibiotics should not be used to treat conditions that we suspect are caused by viruses like the virus that causes the common cold or flu. However, some people can have Strep with atypical symptoms. You may need formal testing in clinic or office to confirm if your symptoms continue or worsen.  Providers prescribe antibiotics to treat infections caused by bacteria. Antibiotics are very powerful in treating bacterial infections when they are used properly.  To maintain their effectiveness, they should be used only when necessary.  Overuse of antibiotics has resulted in the development of super bugs that are resistant to treatment!    Home Care: Only take medications as instructed by your medical team. Do not drink alcohol while taking these medications. A steam or ultrasonic humidifier can help congestion.  You can place a towel over your head and  breathe in the steam from hot water coming from a faucet. Avoid close contacts especially the very young and the elderly. Cover your mouth when you cough or sneeze. Always remember to wash your hands.  Get Help Right Away If: You develop worsening fever or throat pain. You develop a severe head ache or visual changes. Your symptoms persist after you have completed your treatment plan.  Make sure you Understand these instructions. Will watch your condition. Will get help right away if you are not doing well or get worse.   Thank you for choosing an e-visit.  Your e-visit answers were reviewed by a board certified advanced clinical practitioner to complete your personal care plan. Depending upon the condition, your plan could have included both over the counter or prescription medications.  Please review your pharmacy choice. Make sure the pharmacy is open so you can pick up prescription now. If there is a problem, you may contact your provider through [employer] and have the prescription routed to another pharmacy.  Your safety is important to [place]. If you have drug allergies check your prescription carefully.   For the next 24 hours you can use MyChart to ask questions about today's visit, request a non-urgent call back, or ask for a work or school excuse. You will get an email in the next two days asking about your experience. I hope that your e-visit has been valuable and will speed your recovery.

## 2022-10-16 ENCOUNTER — Encounter: Payer: Self-pay | Admitting: Family Medicine

## 2022-10-16 DIAGNOSIS — J020 Streptococcal pharyngitis: Secondary | ICD-10-CM

## 2022-12-20 ENCOUNTER — Ambulatory Visit: Payer: BC Managed Care – PPO

## 2023-02-08 ENCOUNTER — Other Ambulatory Visit: Payer: Self-pay | Admitting: Family Medicine

## 2023-02-08 DIAGNOSIS — A6000 Herpesviral infection of urogenital system, unspecified: Secondary | ICD-10-CM

## 2023-02-08 MED ORDER — VALACYCLOVIR HCL 1 G PO TABS: ORAL_TABLET | ORAL | 5 refills | Status: AC

## 2023-02-08 NOTE — Telephone Encounter (Signed)
Medication Refill - Medication: valACYclovir (VALTREX) 1000 MG tablet   Completely out of current supply   Has the patient contacted their pharmacy? Yes.   (Agent: If no, request that the patient contact the pharmacy for the refill. If patient does not wish to contact the pharmacy document the reason why and proceed with request.) (Agent: If yes, when and what did the pharmacy advise?)  Preferred Pharmacy (with phone number or street name):  [id number] [place], [place] - [address] AT [facility] OF [address] & [address] [facility]  [address] [place] [address]  Phone: [phone] Fax: [phone]   Has the patient been seen for an appointment in the last year OR does the patient have an upcoming appointment? Yes.    Agent: Please be advised that RX refills may take up to 3 business days. We ask that you follow-up with your pharmacy.

## 2023-02-08 NOTE — Telephone Encounter (Signed)
Requested medication (s) are due for refill today - expired Rx  Requested medication (s) are on the active medication list -yes  Future visit scheduled -yes  Last refill: 12/07/20 #30 5RF  Notes to clinic: expired Rx, former provider Rx- needs new Rx  Requested Prescriptions  Pending Prescriptions Disp Refills   valACYclovir (VALTREX) 1000 MG tablet 30 tablet 5    Sig: Take 1g once daily for five days at first sign of outbreak.     Antimicrobials:  Antiviral Agents - Anti-Herpetic Passed - 02/08/2023  8:31 AM      Passed - Valid encounter within last 12 months    Recent Outpatient Visits           4 months ago Encounter for annual physical exam   [facility], Marzella Schlein, MD   6 months ago Recurrent sinusitis   [facility] Merita Norton T, FNP   8 months ago Acute non-recurrent maxillary sinusitis   [facility] Merita Norton T, FNP   1 year ago Moderate persistent asthmatic bronchitis with acute exacerbation   [facility] Alfredia Ferguson, PA-C   1 year ago Encounter for annual physical exam   [facility], Marzella Schlein, MD       Future Appointments             In 7 months Bacigalupo, Marzella Schlein, MD [facility]               Requested Prescriptions  Pending Prescriptions Disp Refills   valACYclovir (VALTREX) 1000 MG tablet 30 tablet 5    Sig: Take 1g once daily for five days at first sign of outbreak.     Antimicrobials:  Antiviral Agents - Anti-Herpetic Passed - 02/08/2023  8:31 AM      Passed - Valid encounter within last 12 months    Recent Outpatient Visits           4 months ago Encounter for annual physical exam   [facility], Marzella Schlein, MD   6 months ago Recurrent sinusitis   [facility] Merita Norton T, FNP   8  months ago Acute non-recurrent maxillary sinusitis   [facility] Merita Norton T, FNP   1 year ago Moderate persistent asthmatic bronchitis with acute exacerbation   [facility] Alfredia Ferguson, PA-C   1 year ago Encounter for annual physical exam   [facility], Marzella Schlein, MD       Future Appointments             In 7 months Bacigalupo, Marzella Schlein, MD [facility]

## 2023-02-22 ENCOUNTER — Other Ambulatory Visit: Payer: Self-pay | Admitting: Family Medicine

## 2023-04-06 ENCOUNTER — Other Ambulatory Visit: Payer: Self-pay

## 2023-04-06 ENCOUNTER — Emergency Department
Admission: EM | Admit: 2023-04-06 | Discharge: 2023-04-06 | Disposition: A | Discharge status: Home / Self Care | Payer: BC Managed Care – PPO | Attending: Emergency Medicine | Admitting: Emergency Medicine

## 2023-04-06 ENCOUNTER — Emergency Department: Payer: BC Managed Care – PPO

## 2023-04-06 DIAGNOSIS — Z3A11 11 weeks gestation of pregnancy: Secondary | ICD-10-CM | POA: Insufficient documentation

## 2023-04-06 DIAGNOSIS — O039 Complete or unspecified spontaneous abortion without complication: Secondary | ICD-10-CM | POA: Insufficient documentation

## 2023-04-06 DIAGNOSIS — O209 Hemorrhage in early pregnancy, unspecified: Secondary | ICD-10-CM | POA: Diagnosis present

## 2023-04-06 LAB — CBC WITH DIFFERENTIAL/PLATELET
Abs Immature Granulocytes: 0.04 10*3/uL (ref 0.00–0.07)
Basophils Absolute: 0.1 10*3/uL (ref 0.0–0.1)
Basophils Relative: 1 %
Eosinophils Absolute: 0.6 10*3/uL — ABNORMAL HIGH (ref 0.0–0.5)
Eosinophils Relative: 6 %
HCT: 40 % (ref 36.0–46.0)
Hemoglobin: 13.6 g/dL (ref 12.0–15.0)
Immature Granulocytes: 0 %
Lymphocytes Relative: 16 %
Lymphs Abs: 1.5 10*3/uL (ref 0.7–4.0)
MCH: 30.8 pg (ref 26.0–34.0)
MCHC: 34 g/dL (ref 30.0–36.0)
MCV: 90.5 fL (ref 80.0–100.0)
Monocytes Absolute: 0.5 10*3/uL (ref 0.1–1.0)
Monocytes Relative: 5 %
Neutro Abs: 6.9 10*3/uL (ref 1.7–7.7)
Neutrophils Relative %: 72 %
Platelets: 358 10*3/uL (ref 150–400)
RBC: 4.42 MIL/uL (ref 3.87–5.11)
RDW: 12.1 % (ref 11.5–15.5)
WBC: 9.6 10*3/uL (ref 4.0–10.5)
nRBC: 0 % (ref 0.0–0.2)

## 2023-04-06 LAB — COMPREHENSIVE METABOLIC PANEL
ALT: 12 U/L (ref 0–44)
AST: 16 U/L (ref 15–41)
Albumin: 3.9 g/dL (ref 3.5–5.0)
Alkaline Phosphatase: 44 U/L (ref 38–126)
Anion gap: 11 (ref 5–15)
BUN: 10 mg/dL (ref 6–20)
CO2: 21 mmol/L — ABNORMAL LOW (ref 22–32)
Calcium: 9.1 mg/dL (ref 8.9–10.3)
Chloride: 103 mmol/L (ref 98–111)
Creatinine, Ser: 0.53 mg/dL (ref 0.44–1.00)
GFR, Estimated: >60 mL/min (ref >60)
Glucose, Bld: 118 mg/dL — ABNORMAL HIGH (ref 70–99)
Potassium: 3.6 mmol/L (ref 3.5–5.1)
Sodium: 135 mmol/L (ref 135–145)
Total Bilirubin: 0.7 mg/dL (ref 0.3–1.2)
Total Protein: 7.3 g/dL (ref 6.5–8.1)

## 2023-04-06 LAB — ABO/RH: ABO/RH(D): A POS

## 2023-04-06 LAB — HCG, QUANTITATIVE, PREGNANCY: hCG, Beta Chain, Quant, S: 7450 m[IU]/mL — ABNORMAL HIGH (ref <5)

## 2023-04-06 NOTE — ED Provider Notes (Signed)
[facility] Provider Note  Patient Contact: 7:18 PM (approximate)   History   Vaginal Bleeding   HPI  Shelley Walton is a 27 y.o. female who presents the emergency department for abdominal cramping and vaginal bleeding.  Patient is roughly [redacted] weeks pregnant.  Patient has had an ultrasound with viable pregnancy during this pregnancy.  She had a miscarriage to her last pregnancy, she is G2, P0.  No vaginal discharge, dysuria, polyuria, hematuria.     Physical Exam   Triage Vital Signs: ED Triage Vitals  Encounter Vitals Group     BP 04/06/23 1505 114/74     Systolic BP Percentile --      Diastolic BP Percentile --      Pulse Rate 04/06/23 1505 (!) 107     Resp 04/06/23 1505 17     Temp 04/06/23 1505 98.4 F (36.9 C)     Temp Source 04/06/23 1505 Oral     SpO2 04/06/23 1505 97 %     Weight --      Height --      Head Circumference --      Peak Flow --      Pain Score 04/06/23 1502 5     Pain Loc --      Pain Education --      Exclude from Growth Chart --     Most recent vital signs: Vitals:   04/06/23 1505 04/06/23 1833  BP: 114/74 108/67  Pulse: (!) 107 93  Resp: 17 18  Temp: 98.4 F (36.9 C) 98.3 F (36.8 C)  SpO2: 97% 96%     General: Alert and in no acute distress.  Cardiovascular:  Good peripheral perfusion Respiratory: Normal respiratory effort without tachypnea or retractions. Lungs CTAB.  Gastrointestinal: Bowel sounds 4 quadrants. Soft and nontender to palpation. No guarding or rigidity. No palpable masses. No distention. No CVA tenderness. Musculoskeletal: Full range of motion to all extremities.  Neurologic:  No gross focal neurologic deficits are appreciated.  Skin:   No rash noted Other:   ED Results / Procedures / Treatments   Labs (all labs ordered are listed, but only abnormal results are displayed) Labs Reviewed  HCG, QUANTITATIVE, PREGNANCY - Abnormal; Notable for the following components:      Result  Value   hCG, Beta Chain, Quant, S 7,450 (*)    All other components within normal limits  COMPREHENSIVE METABOLIC PANEL - Abnormal; Notable for the following components:   CO2 21 (*)    Glucose, Bld 118 (*)    All other components within normal limits  CBC WITH DIFFERENTIAL/PLATELET - Abnormal; Notable for the following components:   Eosinophils Absolute 0.6 (*)    All other components within normal limits  URINALYSIS, ROUTINE W REFLEX MICROSCOPIC  ABO/RH     EKG     RADIOLOGY  I personally viewed, evaluated, and interpreted these images as part of my medical decision making, as well as reviewing the written report by the radiologist.  ED Provider Interpretation: Patient with empty gestational sac consistent with failed pregnancy/miscarriage  US OB LESS THAN 14 WEEKS WITH OB TRANSVAGINAL  Result Date: 04/06/2023 CLINICAL DATA:  Abdominal pain in 1st trimester pregnancy. EXAM: OBSTETRIC <14 WK [place] AND TRANSVAGINAL OB US TECHNIQUE: Both transabdominal and transvaginal ultrasound examinations were performed for complete evaluation of the gestation as well as the maternal uterus, adnexal regions, and pelvic cul-de-sac. Transvaginal technique was performed to assess early pregnancy. COMPARISON:  None  Available. FINDINGS: Intrauterine gestational sac: Single Yolk sac:  Not Visualized. Embryo:  Visualized. Cardiac Activity: Not Visualized. CRL:  23 mm   9 w   0 d                  [place] EDC: 11/09/2023 Subchorionic hemorrhage:  None visualized. Maternal uterus/adnexae: Normal appearance of left ovary. Simple appearing cyst is seen in the right ovary measuring 4.1 x 4.0 x 3.4 cm. No other adnexal mass or abnormal free fluid identified. IMPRESSION: Findings meet definitive criteria for failed pregnancy. This follows SRU consensus guidelines: Diagnostic Criteria for Nonviable Pregnancy Early in the First Trimester. [facility] Mis J Med [phone]. 4.1 cm benign-appearing right ovarian cyst. Electronically  Signed   By: Danae Orleans M.D.   On: 04/06/2023 18:06    PROCEDURES:  Critical Care performed: No  Procedures   MEDICATIONS ORDERED IN ED: Medications - No data to display   IMPRESSION / MDM / ASSESSMENT AND PLAN / ED COURSE  I reviewed the triage vital signs and the nursing notes.                                 Differential diagnosis includes, but is not limited to, miscarriage, vaginal bleeding in pregnancy, subchorionic hematoma  Patient's presentation is most consistent with acute presentation with potential threat to life or bodily function.   Patient's diagnosis is consistent with miscarriage.  Patient presents emergency department with vaginal bleeding and abdominal cramping.  Patient is in first trimester pregnancy.  She was G2, P0 with a previous miscarriage.  Patient had reassuring physical exam, labs are reassuring.  Imaging reveals findings consistent with miscarriage.  I do recommend following up with OB/GYN but after lengthy discussion with the patient he would also like to pursue fertility testing.  I recommend following up with primary care for referral for fertility testing..Patient is given ED precautions to return to the ED for any worsening or new symptoms.     FINAL CLINICAL IMPRESSION(S) / ED DIAGNOSES   Final diagnoses:  Miscarriage     Rx / DC Orders   ED Discharge Orders     None        Note:  This document was prepared using Dragon voice recognition software and may include unintentional dictation errors.   Lanette Hampshire 04/06/23 Dionne Milo, MD 04/07/23 1426

## 2023-04-19 ENCOUNTER — Other Ambulatory Visit: Payer: Self-pay | Admitting: Obstetrics and Gynecology

## 2023-04-19 ENCOUNTER — Encounter: Payer: Self-pay | Admitting: Obstetrics and Gynecology

## 2023-04-19 ENCOUNTER — Encounter
Admission: RE | Disposition: A | Discharge status: Home / Self Care | Payer: Self-pay | Source: Ambulatory Visit | Attending: Obstetrics and Gynecology

## 2023-04-19 ENCOUNTER — Ambulatory Visit: Payer: BC Managed Care – PPO | Admitting: Certified Registered"

## 2023-04-19 ENCOUNTER — Other Ambulatory Visit: Payer: Self-pay

## 2023-04-19 ENCOUNTER — Ambulatory Visit
Admission: RE | Admit: 2023-04-19 | Discharge: 2023-04-19 | Disposition: A | Discharge status: Home / Self Care | Payer: BC Managed Care – PPO | Source: Ambulatory Visit | Attending: Obstetrics and Gynecology | Admitting: Obstetrics and Gynecology

## 2023-04-19 DIAGNOSIS — J45909 Unspecified asthma, uncomplicated: Secondary | ICD-10-CM | POA: Diagnosis not present

## 2023-04-19 DIAGNOSIS — O034 Incomplete spontaneous abortion without complication: Secondary | ICD-10-CM | POA: Insufficient documentation

## 2023-04-19 DIAGNOSIS — Z9889 Other specified postprocedural states: Secondary | ICD-10-CM

## 2023-04-19 HISTORY — PX: DILATION AND EVACUATION: SHX1459

## 2023-04-19 LAB — TYPE AND SCREEN
ABO/RH(D): A POS
Antibody Screen: NEGATIVE

## 2023-04-19 SURGERY — DILATION AND EVACUATION, UTERUS
Anesthesia: General | Site: Vagina

## 2023-04-19 MED ORDER — OXYCODONE HCL 5 MG/5ML PO SOLN: 5.0000 mg | Freq: Once | ORAL | Status: AC | PRN

## 2023-04-19 MED ORDER — LACTATED RINGERS IV SOLN: INTRAVENOUS | Status: DC

## 2023-04-19 MED ORDER — OXYCODONE HCL 5 MG PO TABS
5.0000 mg | ORAL_TABLET | Freq: Once | ORAL | Status: AC | PRN
  Administered 2023-04-19: 5 mg via ORAL

## 2023-04-19 MED ORDER — MIDAZOLAM HCL 5 MG/5ML IJ SOLN
INTRAMUSCULAR | Status: DC | PRN
  Administered 2023-04-19: 2 mg via INTRAVENOUS

## 2023-04-19 MED ORDER — FENTANYL CITRATE (PF) 100 MCG/2ML IJ SOLN
INTRAMUSCULAR | Status: DC | PRN
  Administered 2023-04-19 (×2): 50 ug via INTRAVENOUS

## 2023-04-19 MED ORDER — 0.9 % SODIUM CHLORIDE (POUR BTL) OPTIME
TOPICAL | Status: DC | PRN
  Administered 2023-04-19: 500 mL

## 2023-04-19 MED ORDER — GLYCOPYRROLATE 0.2 MG/ML IJ SOLN
INTRAMUSCULAR | Status: DC | PRN
  Administered 2023-04-19: .2 mg via INTRAVENOUS

## 2023-04-19 MED ORDER — CHLORHEXIDINE GLUCONATE 0.12 % MT SOLN
OROMUCOSAL | Status: AC
  Filled 2023-04-19: qty 15

## 2023-04-19 MED ORDER — LIDOCAINE HCL (PF) 2 % IJ SOLN
INTRAMUSCULAR | Status: AC
  Filled 2023-04-19: qty 5

## 2023-04-19 MED ORDER — SCOPOLAMINE 1 MG/3DAYS TD PT72
MEDICATED_PATCH | TRANSDERMAL | Status: AC
  Filled 2023-04-19: qty 1

## 2023-04-19 MED ORDER — MIDAZOLAM HCL 2 MG/2ML IJ SOLN
INTRAMUSCULAR | Status: AC
  Filled 2023-04-19: qty 2

## 2023-04-19 MED ORDER — OXYCODONE HCL 5 MG PO TABS
ORAL_TABLET | ORAL | Status: AC
  Filled 2023-04-19: qty 1

## 2023-04-19 MED ORDER — LIDOCAINE HCL (PF) 2 % IJ SOLN
INTRAMUSCULAR | Status: DC | PRN
  Administered 2023-04-19: 80 mg

## 2023-04-19 MED ORDER — DOXYCYCLINE HYCLATE 100 MG IV SOLR
200.0000 mg | INTRAVENOUS | Status: AC
  Administered 2023-04-19: 200 mg via INTRAVENOUS
  Filled 2023-04-19: qty 200

## 2023-04-19 MED ORDER — FENTANYL CITRATE (PF) 100 MCG/2ML IJ SOLN
INTRAMUSCULAR | Status: AC
  Filled 2023-04-19: qty 2

## 2023-04-19 MED ORDER — CHLORHEXIDINE GLUCONATE 0.12 % MT SOLN
15.0000 mL | Freq: Once | OROMUCOSAL | Status: AC
  Administered 2023-04-19: 15 mL via OROMUCOSAL

## 2023-04-19 MED ORDER — SCOPOLAMINE 1 MG/3DAYS TD PT72
1.0000 | MEDICATED_PATCH | TRANSDERMAL | Status: DC
  Administered 2023-04-19: 1.5 mg via TRANSDERMAL

## 2023-04-19 MED ORDER — DEXAMETHASONE SODIUM PHOSPHATE 10 MG/ML IJ SOLN
INTRAMUSCULAR | Status: DC | PRN
  Administered 2023-04-19: 10 mg via INTRAVENOUS

## 2023-04-19 MED ORDER — PROPOFOL 1000 MG/100ML IV EMUL
INTRAVENOUS | Status: AC
  Filled 2023-04-19: qty 100

## 2023-04-19 MED ORDER — FENTANYL CITRATE (PF) 100 MCG/2ML IJ SOLN
25.0000 ug | INTRAMUSCULAR | Status: DC | PRN
  Administered 2023-04-19 (×2): 25 ug via INTRAVENOUS

## 2023-04-19 MED ORDER — IBUPROFEN 600 MG PO TABS
600.0000 mg | ORAL_TABLET | Freq: Four times a day (QID) | ORAL | 0 refills | Status: DC
Start: 2023-04-19 — End: 2023-09-19

## 2023-04-19 MED ORDER — ONDANSETRON HCL 4 MG/2ML IJ SOLN
INTRAMUSCULAR | Status: DC | PRN
  Administered 2023-04-19: 4 mg via INTRAVENOUS

## 2023-04-19 MED ORDER — ONDANSETRON 4 MG PO TBDP
4.0000 mg | ORAL_TABLET | Freq: Four times a day (QID) | ORAL | 0 refills | Status: DC | PRN
Start: 2023-04-19 — End: 2023-09-19

## 2023-04-19 MED ORDER — DOXYCYCLINE HYCLATE 100 MG IV SOLR
200.0000 mg | INTRAVENOUS | Status: DC
  Filled 2023-04-19: qty 200

## 2023-04-19 MED ORDER — PROPOFOL 10 MG/ML IV BOLUS
INTRAVENOUS | Status: DC | PRN
  Administered 2023-04-19: 50 mg via INTRAVENOUS
  Administered 2023-04-19: 150 mg via INTRAVENOUS

## 2023-04-19 MED ORDER — SILVER NITRATE-POT NITRATE 75-25 % EX MISC
CUTANEOUS | Status: AC
  Filled 2023-04-19: qty 10

## 2023-04-19 MED ORDER — SILVER NITRATE-POT NITRATE 75-25 % EX MISC
CUTANEOUS | Status: DC | PRN
  Administered 2023-04-19: 4

## 2023-04-19 MED ORDER — HYDROCODONE-ACETAMINOPHEN 5-325 MG PO TABS
1.0000 | ORAL_TABLET | Freq: Four times a day (QID) | ORAL | 0 refills | Status: DC | PRN
Start: 2023-04-19 — End: 2023-09-19

## 2023-04-19 MED ORDER — PROPOFOL 500 MG/50ML IV EMUL
INTRAVENOUS | Status: DC | PRN
  Administered 2023-04-19: 200 ug/kg/min via INTRAVENOUS

## 2023-04-19 SURGICAL SUPPLY — 29 items
8MM CURVED IMPLANT
BAG DRN RND TRDRP ANRFLXCHMBR (UROLOGICAL SUPPLIES)
BAG URINE DRAIN 2000ML AR STRL (UROLOGICAL SUPPLIES) IMPLANT
CATH FOLEY 2WAY 5CC 16FR (CATHETERS)
CATH URTH 16FR FL 2W BLN LF (CATHETERS) IMPLANT
DRSG TELFA 3X8 NADH STRL (GAUZE/BANDAGES/DRESSINGS) IMPLANT
FILTER UTR ASPR SPEC (MISCELLANEOUS) ×1 IMPLANT
FLTR UTR ASPR SPEC (MISCELLANEOUS) ×1
GLOVE BIO SURGEON STRL SZ7 (GLOVE) ×1 IMPLANT
GOWN STRL REUS W/ TWL LRG LVL3 (GOWN DISPOSABLE) ×2 IMPLANT
GOWN STRL REUS W/TWL LRG LVL3 (GOWN DISPOSABLE) ×2
KIT BERKELEY 1ST TRIMESTER 3/8 (MISCELLANEOUS) ×1 IMPLANT
KIT TURNOVER CYSTO (KITS) ×1 IMPLANT
MANIFOLD NEPTUNE II (INSTRUMENTS) ×1 IMPLANT
NS IRRIG 500ML POUR BTL (IV SOLUTION) ×1 IMPLANT
PACK DNC HYST (MISCELLANEOUS) ×1 IMPLANT
PAD OB MATERNITY 4.3X12.25 (PERSONAL CARE ITEMS) ×1 IMPLANT
PAD PREP OB/GYN DISP 24X41 (PERSONAL CARE ITEMS) ×1 IMPLANT
SCRUB CHG 4% DYNA-HEX 4OZ (MISCELLANEOUS) ×1 IMPLANT
SET BERKELEY SUCTION TUBING (SUCTIONS) ×1 IMPLANT
SET CYSTO W/LG BORE CLAMP LF (SET/KITS/TRAYS/PACK) IMPLANT
TOWEL OR 17X26 4PK STRL BLUE (TOWEL DISPOSABLE) ×1 IMPLANT
TRAP FLUID SMOKE EVACUATOR (MISCELLANEOUS) ×1 IMPLANT
VACURETTE 10 RIGID CVD (CANNULA) IMPLANT
VACURETTE 12 RIGID CVD (CANNULA) IMPLANT
VACURETTE 7MM F TIP STRL (CANNULA) IMPLANT
VACURETTE 8 RIGID CVD (CANNULA) IMPLANT
VACURETTE 8MM F TIP (MISCELLANEOUS) IMPLANT
WATER STERILE IRR 500ML POUR (IV SOLUTION) ×1 IMPLANT

## 2023-04-19 NOTE — Discharge Instructions (Addendum)
AMBULATORY SURGERY  DISCHARGE INSTRUCTIONS   The drugs that you were given will stay in your system until tomorrow so for the next 24 hours you should not:  Drive an automobile Make any legal decisions Drink any alcoholic beverage   You may resume regular meals tomorrow.  Today it is better to start with liquids and gradually work up to solid foods.  You may eat anything you prefer, but it is better to start with liquids, then soup and crackers, and gradually work up to solid foods.   Please notify your doctor immediately if you have any unusual bleeding, trouble breathing, redness and pain at the surgery site, drainage, fever, or pain not relieved by medication.    Additional Instructions: An anti-nausea medication called a Scop patch was applied behind your left ear. Please remove this no later than tomorrow morning.    Please contact your physician with any problems or Same Day Surgery at [phone], Monday through Friday 6 am to 4 pm, or [place] at [facility] number at [phone].

## 2023-04-19 NOTE — Interval H&P Note (Signed)
History and Physical Interval Note:  04/19/2023 4:54 PM  Shelley Walton  has presented today for surgery, with the diagnosis of missed abortion.  The various methods of treatment have been discussed with the patient and family. After consideration of risks, benefits and other options for treatment, the patient has consented to  Procedure(s): SUCTION D&C (N/A) as a surgical intervention.  The patient's history has been reviewed, patient examined, no change in status, stable for surgery.  I have reviewed the patient's chart and labs.  Questions were answered to the patient's satisfaction.    Thomasene Mohair, MD, [facility] 04/19/2023 4:54 PM

## 2023-04-19 NOTE — Anesthesia Preprocedure Evaluation (Signed)
Anesthesia Evaluation  Patient identified by MRN, date of birth, ID band Patient awake    Reviewed: Allergy & Precautions, NPO status , Patient's Chart, lab work & pertinent test results  History of Anesthesia Complications (+) PONV and history of anesthetic complications  Airway Mallampati: II  TM Distance: >3 FB Neck ROM: full    Dental  (+) Dental Advidsory Given, Teeth Intact   Pulmonary asthma    Pulmonary exam normal        Cardiovascular negative cardio ROS Normal cardiovascular exam     Neuro/Psych negative neurological ROS  negative psych ROS   GI/Hepatic negative GI ROS, Neg liver ROS,,,  Endo/Other  negative endocrine ROS    Renal/GU      Musculoskeletal   Abdominal   Peds  Hematology negative hematology ROS (+)   Anesthesia Other Findings Past Medical History: No date: Asthma     Comment:  daily inhaler No date: Family history of breast cancer     Comment:  mom is MyRisk neg except BRCA2 VUS No date: Increased risk of breast cancer 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 Dwain Sarna No date: PONV (postoperative nausea and vomiting)  Past Surgical History: 10/14/2013: MASS EXCISION; Right     Comment:  Procedure: RIGHT BREAST MASS EXCISION;  Surgeon: Emelia Loron, MD;  Location: [facility];                Service: General;  Laterality: Right; 07/16/2019: RADIOACTIVE SEED GUIDED EXCISIONAL BREAST BIOPSY; Left     Comment:  Procedure: RADIOACTIVE SEED GUIDED EXCISIONAL LEFT               BREAST BIOPSY;  Surgeon: Emelia Loron, MD;                Location: [facility];  Service: General;                Laterality: Left; No date: WISDOM TOOTH EXTRACTION  BMI    Body Mass Index: 29.70 kg/m      Reproductive/Obstetrics negative OB ROS                             Anesthesia Physical Anesthesia  Plan  ASA: 2  Anesthesia Plan: General   Post-op Pain Management:    Induction: Intravenous  PONV Risk Score and Plan: 4 or greater and Ondansetron, Dexamethasone, Propofol infusion, TIVA and Midazolam  Airway Management Planned: LMA  Additional Equipment:   Intra-op Plan:   Post-operative Plan: Extubation in OR  Informed Consent: I have reviewed the patients History and Physical, chart, labs and discussed the procedure including the risks, benefits and alternatives for the proposed anesthesia with the patient or authorized representative who has indicated his/her understanding and acceptance.     Dental Advisory Given  Plan Discussed with: Anesthesiologist, CRNA and Surgeon  Anesthesia Plan Comments: (Patient consented for risks of anesthesia including but not limited to:  - adverse reactions to medications - damage to eyes, teeth, lips or other oral mucosa - nerve damage due to positioning  - sore throat or hoarseness - Damage to heart, brain, nerves, lungs, other parts of body or loss of life  Patient voiced understanding.)       Anesthesia Quick Evaluation

## 2023-04-19 NOTE — Anesthesia Postprocedure Evaluation (Signed)
Anesthesia Post Note  Patient: Shelley Walton  Procedure(s) Performed: SUCTION D&C (Vagina )  Patient location during evaluation: PACU Anesthesia Type: General Level of consciousness: awake and alert Pain management: pain level controlled Vital Signs Assessment: post-procedure vital signs reviewed and stable Respiratory status: spontaneous breathing, nonlabored ventilation, respiratory function stable and patient connected to nasal cannula oxygen Cardiovascular status: blood pressure returned to baseline and stable Postop Assessment: no apparent nausea or vomiting Anesthetic complications: no  No notable events documented.   Last Vitals:  Vitals:   04/19/23 1815 04/19/23 1830  BP: 112/76 113/85  Pulse: 81 68  Resp: 20 19  Temp:  36.7 C  SpO2: 99% 100%    Last Pain:  Vitals:   04/19/23 1830  TempSrc:   PainSc: 4                  Stephanie Coup

## 2023-04-19 NOTE — Anesthesia Procedure Notes (Signed)
Procedure Name: LMA Insertion Date/Time: 04/19/2023 5:26 PM  Performed by: Mathews Argyle, CRNAPre-anesthesia Checklist: Patient identified, Patient being monitored, Timeout performed, Emergency Drugs available and Suction available Patient Re-evaluated:Patient Re-evaluated prior to induction Oxygen Delivery Method: Circle system utilized Preoxygenation: Pre-oxygenation with 100% oxygen Induction Type: IV induction Ventilation: Mask ventilation without difficulty LMA: LMA inserted LMA Size: 3.0 Tube type: Oral Number of attempts: 1 Placement Confirmation: positive ETCO2 and breath sounds checked- equal and bilateral Tube secured with: Tape Dental Injury: Teeth and Oropharynx as per pre-operative assessment

## 2023-04-19 NOTE — H&P (Signed)
Preoperative History and Physical  Shelley Walton is a 27 y.o. G2P0000 here for surgical management of incomplete spontaneous abortion.   No significant preoperative concerns.  History of Present Illness: 27 y.o. G38P0010 female who presents with an incomplete miscarriage. Her LMP was 01/18/2023.  She had an ultrasound on 03/11/2023 that showed a gestational age of [redacted]w[redacted]d, consistent with her estimated EDD by LMP of 10/25/2023. She had an ultrasound on 04/06/2023 due to concern for miscarriage that showed a CRL of 23 mm and no cardiac activity. She took one dose of misoprostol and has had bleeding. However, she had an ultrasound on 8/21 that showed a lining that measured 18 mm and vascularity, indicating that she still has not passed all the products of conception.   On 8/10 she had some spotting and abnormal cramping.  She at first waited, then tried misoprostol and had some intense bleeding, then things subsided quite a bit.  She's at the point now where she doesn't want to keep waiting.   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 Dwain Sarna   PONV (postoperative nausea and vomiting)    Past Surgical History:  Procedure Laterality Date   MASS EXCISION Right 10/14/2013   Procedure: RIGHT BREAST MASS EXCISION;  Surgeon: Emelia Loron, MD;  Location: [facility];  Service: General;  Laterality: Right;   RADIOACTIVE SEED GUIDED EXCISIONAL BREAST BIOPSY Left 07/16/2019   Procedure: RADIOACTIVE SEED GUIDED EXCISIONAL LEFT BREAST BIOPSY;  Surgeon: Emelia Loron, MD;  Location:  SURGERY CENTER;  Service: General;  Laterality: Left;   WISDOM TOOTH EXTRACTION     OB History  Gravida Para Term Preterm AB Living  2 0 0  0 0 0  SAB IAB Ectopic Multiple Live Births  0 0 0 0 0    # Outcome Date GA Lbr Len/2nd Weight Sex Type Anes PTL Lv  2 Current           1 Gravida           Patient denies any other pertinent gynecologic issues.   No current facility-administered medications on file prior to encounter.   Current Outpatient Medications on File Prior to Encounter  Medication Sig Dispense Refill   albuterol (VENTOLIN HFA) 108 (90 Base) MCG/ACT inhaler INL 2 PFS ITL Q 6 H PRF WHZ OR SOB     azithromycin (ZITHROMAX Z-PAK) 250 MG tablet Take 1 tablet (250 mg total) by mouth daily. Take 2 tablets on the first day and then 1 tablet daily thereafter for a total of 5 days of treatment. 6 tablet 0   fexofenadine (ALLEGRA) 180 MG tablet Take 180 mg by mouth daily.     fluconazole (DIFLUCAN) 150 MG tablet TAKE 1 TABLET BY MOUTH ONCE FOR ANTIBIOTIC RELATED URINARY TRACT INFECTION, IF SYMPTOMS REMAIN 4 DAYS LATER REPEAT DOSE 2 tablet 0   ipratropium (ATROVENT) 0.06 % nasal spray Place 2 sprays into both nostrils 4 (four) times daily. 15 mL 12   montelukast (SINGULAIR) 10 MG tablet TAKE 1 TABLET(10 MG) BY MOUTH AT BEDTIME 90 tablet 1   nitrofurantoin, macrocrystal-monohydrate, (MACROBID) 100 MG capsule Take 1 capsule (100 mg total) by mouth  2 (two) times daily. 10 capsule 0   valACYclovir (VALTREX) 1000 MG tablet Take 1g once daily for five days at first sign of outbreak. 30 tablet 5   WIXELA INHUB 250-50 MCG/ACT AEPB INHALE 1 PUFF INTO THE LUNGS IN THE MORNING AND AT BEDTIME 60 each 5   [DISCONTINUED] ethynodiol-ethinyl estradiol (ZOVIA) 1-35 MG-MCG tablet Take 1 tablet by mouth daily. 84 tablet 3   Allergies  Allergen Reactions   Amoxicillin Nausea And Vomiting    Social History:   reports that she has never smoked. She has never used smokeless tobacco. She reports that she does not currently use alcohol. She reports that she does not use drugs.  Family History  Problem Relation Age of Onset   Breast cancer Mother  45       myRisk neg   Multiple sclerosis Mother    Diabetes Maternal Grandfather    Hypertension Maternal Grandfather    Heart disease Maternal Grandfather    Diabetes Maternal Grandmother    Multiple sclerosis Maternal Uncle    Healthy Brother    Healthy Brother    Breast cancer Other 58    Review of Systems: Noncontributory  PHYSICAL EXAM: BP 109/73   Pulse 79   Ht 162.6 cm (5\' 4" )   Wt 79.2 kg (174 lb 9.6 oz)   LMP 01/18/2023 (Exact Date)   BMI 29.97 kg/m  Patient's last menstrual period was 01/18/2023 (exact date). CONSTITUTIONAL: Well-developed, well-nourished female in no acute distress.  HENT:  Normocephalic, atraumatic, External right and left ear normal. Oropharynx is clear and moist EYES: Conjunctivae and EOM are normal. Pupils are equal, round, and reactive to light. No scleral icterus.  NECK: Normal range of motion, supple, no masses SKIN: Skin is warm and dry. No rash noted. Not diaphoretic. No erythema. No pallor. NEUROLGIC: Alert and oriented to person, place, and time. Normal reflexes, muscle tone coordination. No cranial nerve deficit noted. PSYCHIATRIC: Normal mood and affect. Normal behavior. Normal judgment and thought content. CARDIOVASCULAR: Normal heart rate noted, regular rhythm RESPIRATORY: Effort and breath sounds normal, no problems with respiration noted ABDOMEN: Soft, nontender, nondistended. PELVIC: Deferred MUSCULOSKELETAL: Normal range of motion. No edema and no tenderness. 2+ distal pulses.  Labs: Results for orders placed or performed during the hospital encounter of 04/06/23 (from the past 336 hour(s))  hCG, quantitative, pregnancy   Collection Time: 04/06/23  3:07 PM  Result Value Ref Range   hCG, Beta Chain, Quant, S 7,450 (H) <5 mIU/mL  Comprehensive metabolic panel   Collection Time: 04/06/23  3:07 PM  Result Value Ref Range   Sodium 135 135 - 145 mmol/L   Potassium 3.6 3.5 - 5.1 mmol/L   Chloride 103 98 - 111 mmol/L   CO2 21 (L)  22 - 32 mmol/L   Glucose, Bld 118 (H) 70 - 99 mg/dL   BUN 10 6 - 20 mg/dL   Creatinine, Ser 1.61 0.44 - 1.00 mg/dL   Calcium 9.1 8.9 - 09.6 mg/dL   Total Protein 7.3 6.5 - 8.1 g/dL   Albumin 3.9 3.5 - 5.0 g/dL   AST 16 15 - 41 U/L   ALT 12 0 - 44 U/L   Alkaline Phosphatase 44 38 - 126 U/L   Total Bilirubin 0.7 0.3 - 1.2 mg/dL   GFR, Estimated >04 >54 mL/min   Anion gap 11 5 - 15  CBC with Differential   Collection Time: 04/06/23  3:07 PM  Result Value Ref Range   WBC 9.6  4.0 - 10.5 K/uL   RBC 4.42 3.87 - 5.11 MIL/uL   Hemoglobin 13.6 12.0 - 15.0 g/dL   HCT 16.1 09.6 - 04.5 %   MCV 90.5 80.0 - 100.0 fL   MCH 30.8 26.0 - 34.0 pg   MCHC 34.0 30.0 - 36.0 g/dL   RDW 40.9 81.1 - 91.4 %   Platelets 358 150 - 400 K/uL   nRBC 0.0 0.0 - 0.2 %   Neutrophils Relative % 72 %   Neutro Abs 6.9 1.7 - 7.7 K/uL   Lymphocytes Relative 16 %   Lymphs Abs 1.5 0.7 - 4.0 K/uL   Monocytes Relative 5 %   Monocytes Absolute 0.5 0.1 - 1.0 K/uL   Eosinophils Relative 6 %   Eosinophils Absolute 0.6 (H) 0.0 - 0.5 K/uL   Basophils Relative 1 %   Basophils Absolute 0.1 0.0 - 0.1 K/uL   Immature Granulocytes 0 %   Abs Immature Granulocytes 0.04 0.00 - 0.07 K/uL  ABO/Rh   Collection Time: 04/06/23  3:07 PM  Result Value Ref Range   ABO/RH(D)      A POS Performed at [facility], [address] [place], [place] [address]     Imaging Studies: US OB LESS THAN 14 WEEKS WITH OB TRANSVAGINAL  Result Date: 04/06/2023 CLINICAL DATA:  Abdominal pain in 1st trimester pregnancy. EXAM: OBSTETRIC <14 WK [place] AND TRANSVAGINAL OB US TECHNIQUE: Both transabdominal and transvaginal ultrasound examinations were performed for complete evaluation of the gestation as well as the maternal uterus, adnexal regions, and pelvic cul-de-sac. Transvaginal technique was performed to assess early pregnancy. COMPARISON:  None Available. FINDINGS: Intrauterine gestational sac: Single Yolk sac:  Not Visualized. Embryo:   Visualized. Cardiac Activity: Not Visualized. CRL:  23 mm   9 w   0 d                  [place] EDC: 11/09/2023 Subchorionic hemorrhage:  None visualized. Maternal uterus/adnexae: Normal appearance of left ovary. Simple appearing cyst is seen in the right ovary measuring 4.1 x 4.0 x 3.4 cm. No other adnexal mass or abnormal free fluid identified. IMPRESSION: Findings meet definitive criteria for failed pregnancy. This follows SRU consensus guidelines: Diagnostic Criteria for Nonviable Pregnancy Early in the First Trimester. [facility] [phone]. 4.1 cm benign-appearing right ovarian cyst. Electronically Signed   By: Danae Orleans M.D.   On: 04/06/2023 18:06    Assessment: Incomplete spontaneous abortion  Plan: Patient will undergo surgical management with the above-noted surgery.   The risks of surgery were discussed in detail with the patient including but not limited to: bleeding which may require transfusion or reoperation; infection which may require antibiotics; injury to surrounding organs which may involve bowel, bladder, ureters ; need for additional procedures including laparoscopy or laparotomy; thromboembolic phenomenon, surgical site problems and other postoperative/anesthesia complications. Likelihood of success in alleviating the patient's condition was discussed. Routine postoperative instructions will be reviewed with the patient and her family in detail after surgery.  The patient concurred with the proposed plan, giving informed written consent for the surgery.  Patient has been NPO since 0730 today. She will remain NPO for procedure.  Anesthesia and OR aware.  Preoperative prophylactic antibiotics, as indicated, and SCDs ordered on call to the OR.  To OR when ready.  Thomasene Mohair, MD 04/19/2023 2:42 PM

## 2023-04-19 NOTE — Op Note (Signed)
  Operative Note    Name: Shelley Walton  Date of Service: 04/19/2023  DOB: August 16, 1996  MRN: [id number]   Pre-Operative Diagnosis: Incomplete spontaneous abortion  Post-Operative Diagnosis: Incomplete spontaneous abortion  Procedures:  Suction, dilation and curettage  Primary Surgeon: Thomasene Mohair, MD   EBL: 125 mL   IVF: 300 mL   Urine output: 50 mL  Specimens: products of conception  Drains: none  Complications: None   Disposition: PACU   Condition: Stable   Findings: small, mobile anteverted uterus with expected products of conception on evacuation. Otherwise, normal anatomy.  Procedure Summary:   After informed consent was confirmed, the patient was taken to the operating room where general anesthesia was induced.  Her legs were carefully placed in the Ovid stirrups.  She was prepped and draped in the standard fashion.  A speculum was placed in the vagina.   A tenaculum was placed on the anterior lip of the cervix.  The cervix was serially dilated to 8 mm.  The 8 mm suction curette was advanced to the uterine fundus.  Two passes were made with the rigid suction curette and one pass with the flexible curette with evacuation of adequate tissue noted.  One pass was made using sharp curettage until a gritty texture was noted.  One final pass was made with the suction curette with no return of tissue.  All instruments were removed from the vagina and the cervix was noted to be hemostatic after removal of the tenaculum and application of silver nitrate. No blood was coming from the cervical os at this point. She was monitored for about 5 minutes with fundal massage to ensure ongoing hemostasis.   The patient tolerated the procedure well.  Sponge, lap, needle, and instrument counts were correct x 2.  VTE prophylaxis: SCDs. Antibiotic prophylaxis: doxycycline 200 mg IV prior to surgery. She was awakened in the operating room and was taken to the PACU in stable condition.    Thomasene Mohair, MD 04/19/2023 5:51 PM

## 2023-04-19 NOTE — Transfer of Care (Signed)
Immediate Anesthesia Transfer of Care Note  Patient: Shelley Walton  Procedure(s) Performed: SUCTION D&C (Vagina )  Patient Location: PACU  Anesthesia Type:General  Level of Consciousness: awake  Airway & Oxygen Therapy: Patient Spontanous Breathing and Patient connected to face mask oxygen  Post-op Assessment: Report given to RN and Post -op Vital signs reviewed and stable  Post vital signs: Reviewed  Last Vitals:  Vitals Value Taken Time  BP 112/69 04/19/23 1757  Temp    Pulse 72 04/19/23 1759  Resp 24 04/19/23 1759  SpO2 100 % 04/19/23 1759  Vitals shown include unfiled device data.  Last Pain:  Vitals:   04/19/23 1609  TempSrc: Temporal  PainSc: 0-No pain         Complications: No notable events documented.

## 2023-04-22 ENCOUNTER — Encounter: Payer: Self-pay | Admitting: Obstetrics and Gynecology

## 2023-08-06 ENCOUNTER — Telehealth: Payer: BC Managed Care – PPO | Admitting: Physician Assistant

## 2023-08-06 DIAGNOSIS — J069 Acute upper respiratory infection, unspecified: Secondary | ICD-10-CM | POA: Diagnosis not present

## 2023-08-06 NOTE — Progress Notes (Signed)
E-Visit for Upper Respiratory Infection   We are sorry you are not feeling well.  Here is how we plan to help!  Based on what you have shared with me, it looks like you may have a viral upper respiratory infection.  Upper respiratory infections are caused by a large number of viruses; however, rhinovirus is the most common cause. Giving rates in our area and current pregnancy status, I recommend home COVID testing as a precaution.  Symptoms vary from person to person, with common symptoms including sore throat, cough, fatigue or lack of energy and feeling of general discomfort.  A low-grade fever of up to 100.4 may present, but is often uncommon.  Symptoms vary however, and are closely related to a person's age or underlying illnesses.  The most common symptoms associated with an upper respiratory infection are nasal discharge or congestion, cough, sneezing, headache and pressure in the ears and face.  These symptoms usually persist for about 3 to 10 days, but can last up to 2 weeks.  It is important to know that upper respiratory infections do not cause serious illness or complications in most cases.    Upper respiratory infections can be transmitted from person to person, with the most common method of transmission being a person's hands.  The virus is able to live on the skin and can infect other persons for up to 2 hours after direct contact.  Also, these can be transmitted when someone coughs or sneezes; thus, it is important to cover the mouth to reduce this risk.  To keep the spread of the illness at bay, good hand hygiene is very important.  This is an infection that is most likely caused by a virus. There are no specific treatments other than to help you with the symptoms until the infection runs its course.  We are sorry you are not feeling well.  Here is how we plan to help!   For nasal congestion, you may use an oral decongestants such as Mucinex or Mucinex-DM. You can also use a saline  nasal rinse to help flush out the nasal passages, as well as a plain antihistamine like Claritin.  If you do not have a history of heart disease, hypertension, diabetes or thyroid disease, prostate/bladder issues or glaucoma, you may also use Sudafed (not Sudafed PE while pregnant though - talk to pharmacist) to treat nasal congestion.  It is highly recommended that you consult with a pharmacist or your primary care physician to ensure this medication is safe for you to take.     If you have a cough, you may use cough suppressants such as Delsym and Robitussin.   If you have a sore or scratchy throat, use a saltwater gargle-  to  teaspoon of salt dissolved in a 4-ounce to 8-ounce glass of warm water.  Gargle the solution for approximately 15-30 seconds and then spit.  It is important not to swallow the solution.  You can also use throat lozenges/cough drops and Chloraseptic spray to help with throat pain or discomfort.  Warm or cold liquids can also be helpful in relieving throat pain.  For headache, pain or general discomfort, you can use Ibuprofen or Tylenol as directed.   Some authorities believe that zinc sprays or the use of Echinacea may shorten the course of your symptoms.   HOME CARE Only take medications as instructed by your medical team. Be sure to drink plenty of fluids. Water is fine as well as fruit juices, sodas  and electrolyte beverages. You may want to stay away from caffeine or alcohol. If you are nauseated, try taking small sips of liquids. How do you know if you are getting enough fluid? Your urine should be a pale yellow or almost colorless. Get rest. Taking a steamy shower or using a humidifier may help nasal congestion and ease sore throat pain. You can place a towel over your head and breathe in the steam from hot water coming from a faucet. Using a saline nasal spray works much the same way. Cough drops, hard candies and sore throat lozenges may ease your cough. Avoid  close contacts especially the very young and the elderly Cover your mouth if you cough or sneeze Always remember to wash your hands.   GET HELP RIGHT AWAY IF: You develop worsening fever. If your symptoms do not improve within 10 days You develop yellow or green discharge from your nose over 3 days. You have coughing fits You develop a severe head ache or visual changes. You develop shortness of breath, difficulty breathing or start having chest pain Your symptoms persist after you have completed your treatment plan  MAKE SURE YOU  Understand these instructions. Will watch your condition. Will get help right away if you are not doing well or get worse.  Thank you for choosing an e-visit.  Your e-visit answers were reviewed by a board certified advanced clinical practitioner to complete your personal care plan. Depending upon the condition, your plan could have included both over the counter or prescription medications.  Please review your pharmacy choice. Make sure the pharmacy is open so you can pick up prescription now. If there is a problem, you may contact your provider through [employer] and have the prescription routed to another pharmacy.  Your safety is important to [place]. If you have drug allergies check your prescription carefully.   For the next 24 hours you can use MyChart to ask questions about today's visit, request a non-urgent call back, or ask for a work or school excuse. You will get an email in the next two days asking about your experience. I hope that your e-visit has been valuable and will speed your recovery.

## 2023-08-06 NOTE — Progress Notes (Signed)
I have spent 5 minutes in review of e-visit questionnaire, review and updating patient chart, medical decision making and response to patient.   Mia Milan Cody Jacklynn Dehaas, PA-C    

## 2023-08-22 ENCOUNTER — Other Ambulatory Visit: Payer: Self-pay | Admitting: Family Medicine

## 2023-08-22 DIAGNOSIS — B379 Candidiasis, unspecified: Secondary | ICD-10-CM

## 2023-09-11 ENCOUNTER — Other Ambulatory Visit: Payer: Self-pay | Admitting: Family Medicine

## 2023-09-12 NOTE — Telephone Encounter (Signed)
Requested Prescriptions  Pending Prescriptions Disp Refills   fluticasone-salmeterol (WIXELA INHUB) 250-50 MCG/ACT AEPB [Pharmacy Med Name: WIXELA INHUB DISKUS 250/50MCG 60S] 60 each 2    Sig: INHALE 1 PUFF INTO THE LUNGS IN THE MORNING AND AT BEDTIME     Pulmonology:  Combination Products Passed - 09/12/2023  8:47 AM      Passed - Valid encounter within last 12 months    Recent Outpatient Visits           12 months ago Encounter for annual physical exam   [facility] [place], Marzella Schlein, MD   1 year ago Recurrent sinusitis   [facility] Merita Norton T, FNP   1 year ago Acute non-recurrent maxillary sinusitis   [facility] Merita Norton T, FNP   1 year ago Moderate persistent asthmatic bronchitis with acute exacerbation   [facility] Alfredia Ferguson, PA-C   2 years ago Encounter for annual physical exam    [facility], Marzella Schlein, MD       Future Appointments             In 1 week Bac[place]al[place], Marzella Schlein, MD [facility]

## 2023-09-19 ENCOUNTER — Encounter: Payer: 59 | Admitting: Family Medicine

## 2023-09-19 ENCOUNTER — Ambulatory Visit (INDEPENDENT_AMBULATORY_CARE_PROVIDER_SITE_OTHER): Payer: 59 | Admitting: Family Medicine

## 2023-09-19 VITALS — BP 121/84 | HR 83 | Resp 16 | Ht 64.0 in | Wt 162.0 lb

## 2023-09-19 DIAGNOSIS — Z0000 Encounter for general adult medical examination without abnormal findings: Secondary | ICD-10-CM

## 2023-09-19 MED ORDER — FLUCONAZOLE 150 MG PO TABS: 150.0000 mg | ORAL_TABLET | Freq: Once | ORAL | 1 refills | Status: DC

## 2023-09-19 NOTE — Progress Notes (Signed)
Complete physical exam   Patient: Shelley Walton   DOB: [birth year]   27 y.o. Female  MRN: [id number] Visit Date: 09/19/2023  Today's healthcare provider: Shirlee Latch, MD   Chief Complaint  Patient presents with   Annual Exam   Subjective    Shelley Walton is a 28 y.o. female who presents today for a complete physical exam.  She reports consuming a general diet.  She generally feels well. She reports sleeping well. She does not have additional problems to discuss today.    Discussed the use of AI scribe software for clinical note transcription with the patient, who gave verbal consent to proceed.  History of Present Illness   The patient, who is currently undergoing fertility treatments, presents for a routine physical examination. The patient has recently had a D&C procedure and has been experiencing regular yeast infections. The patient reports that these infections only respond to fluconazole and requests a refill of this medication. 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 This disorder is localized to pregnancy and increases the risk of blood clots and miscarriages during pregnancy. The patient is due for a Pap smear in the summer and possibly overdue for a tetanus shot. The patient is also in therapy to cope with the emotional toll of her fertility journey.        Last depression screening scores    09/19/2023    3:42 PM 09/17/2022    3:17 PM 08/06/2022    1:33 PM  PHQ 2/9 Scores  PHQ - 2 Score 0 0 0  PHQ- 9 Score  2 2   Last fall risk screening    09/19/2023    3:42 PM  Fall Risk   Falls in the past year? 0  Number falls in past yr: 0  Injury with Fall? 0  Risk for fall due to : No Fall Risks        Medications: Outpatient Medications Prior to Visit  Medication Sig   albuterol (VENTOLIN HFA) 108 (90 Base) MCG/ACT inhaler INL 2 PFS ITL Q 6 H PRF WHZ OR  SOB   fluticasone-salmeterol (WIXELA INHUB) 250-50 MCG/ACT AEPB INHALE 1 PUFF INTO THE LUNGS IN THE MORNING AND AT BEDTIME   metFORMIN (GLUCOPHAGE) 1000 MG tablet Take 1,000 mg by mouth. (Patient not taking: Reported on 09/19/2023)   valACYclovir (VALTREX) 1000 MG tablet Take 1g once daily for five days at first sign of outbreak. (Patient not taking: Reported on 09/19/2023)   [DISCONTINUED] HYDROcodone-acetaminophen (NORCO/VICODIN) 5-325 MG tablet Take 1 tablet by mouth every 6 (six) hours as needed (breakthrough pain).   [DISCONTINUED] ibuprofen (ADVIL) 600 MG tablet Take 1 tablet (600 mg total) by mouth every 6 (six) hours.   [DISCONTINUED] loratadine (CLARITIN) 10 MG tablet Take 10 mg by mouth daily.   [DISCONTINUED] ondansetron (ZOFRAN-ODT) 4 MG disintegrating tablet Take 1 tablet (4 mg total) by mouth every 6 (six) hours as needed for nausea.   No facility-administered medications prior to visit.    Review of Systems    Objective    BP 121/84 (BP Location: Left Arm, Patient Position: Sitting, Cuff Size: Normal)   Pulse 83   Resp 16   Ht 5\' 4"  (1.626 m)   Wt 162 lb (73.5 kg)   LMP 08/26/2023 (Approximate)   Breastfeeding Unknown   BMI 27.81 kg/m    Physical Exam Vitals reviewed.  Constitutional:  General: She is not in acute distress.    Appearance: Normal appearance. She is well-developed. She is not diaphoretic.  HENT:     Head: Normocephalic and atraumatic.     Right Ear: Tympanic membrane, ear canal and external ear normal.     Left Ear: Tympanic membrane, ear canal and external ear normal.     Nose: Nose normal.     Mouth/Throat:     Mouth: Mucous membranes are moist.     Pharynx: Oropharynx is clear. No oropharyngeal exudate.  Eyes:     General: No scleral icterus.    Conjunctiva/sclera: Conjunctivae normal.     Pupils: Pupils are equal, round, and reactive to light.  Neck:     Thyroid: No thyromegaly.  Cardiovascular:     Rate and Rhythm: Normal rate and  regular rhythm.     Heart sounds: Normal heart sounds. No murmur heard. Pulmonary:     Effort: Pulmonary effort is normal. No respiratory distress.     Breath sounds: Normal breath sounds. No wheezing or rales.  Abdominal:     General: There is no distension.     Palpations: Abdomen is soft.     Tenderness: There is no abdominal tenderness.  Musculoskeletal:        General: No deformity.     Cervical back: Neck supple.     Right lower leg: No edema.     Left lower leg: No edema.  Lymphadenopathy:     Cervical: No cervical adenopathy.  Skin:    General: Skin is warm and dry.     Findings: No rash.  Neurological:     Mental Status: She is alert and oriented to person, place, and time. Mental status is at baseline.     Gait: Gait normal.  Psychiatric:        Mood and Affect: Mood normal.        Behavior: Behavior normal.        Thought Content: Thought content normal.      No results found for any visits on 09/19/23.  Assessment & Plan    Routine Health Maintenance and Physical Exam  Exercise Activities and Dietary recommendations  Goals   None      There is no immunization history on file for this patient.  Health Maintenance  Topic Date Due   Pneumococcal Vaccine 29-83 Years old (1 of 2 - PCV) Never done   HPV VACCINES (1 - 3-dose series) Never done   HIV Screening  Never done   DTaP/Tdap/Td (1 - Tdap) Never done   COVID-19 Vaccine (1 - 2024-25 season) Never done   INFLUENZA VACCINE  11/25/2023 (Originally 03/28/2023)   Cervical Cancer Screening (Pap smear)  02/08/2024   Hepatitis C Screening  Completed    Discussed health benefits of physical activity, and encouraged her to engage in regular exercise appropriate for her age and condition.  Problem List Items Addressed This Visit   None Visit Diagnoses       Encounter for annual physical exam    -  Primary       Assessment and Plan    Recurrent Yeast Infections Frequent yeast infections likely  exacerbated by fertility treatments. Prefers fluconazole as over-the-counter options are ineffective. 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 Managed with baby aspirin and Lovenox. Primarily a  pregnancy-related risk. 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 Continued mental health support is important. - Continue therapy sessions as needed - Monitor mental health and provide support  General Health Maintenance Routine physical examination. No significant concerns. Engaged in fertility treatments. Last Pap smear in 2022, due in summer 2025. Likely overdue for tetanus vaccination. Up to date on Hepatitis C screening. Not interested in flu vaccination. - Schedule Pap smear for summer 2025 - Consider tetanus vaccination - Routine follow-up in one year  Follow-up - Follow up in one year for routine physical - Return sooner if new symptoms or concerns arise.        Return in about 1 year (around 09/18/2024) for CPE.     Shirlee Latch, MD  [facility] [phone] (phone) [phone] (fax)  [facility]

## 2023-10-21 ENCOUNTER — Telehealth: Payer: Self-pay

## 2023-10-21 DIAGNOSIS — J4541 Moderate persistent asthma with (acute) exacerbation: Secondary | ICD-10-CM

## 2023-10-21 MED ORDER — BENZONATATE 100 MG PO CAPS
100.0000 mg | ORAL_CAPSULE | Freq: Three times a day (TID) | ORAL | 0 refills | Status: AC | PRN
Start: 2023-10-21 — End: ?

## 2023-10-21 MED ORDER — PREDNISONE 20 MG PO TABS
40.0000 mg | ORAL_TABLET | Freq: Every day | ORAL | 0 refills | Status: AC
Start: 2023-10-21 — End: ?

## 2023-10-21 MED ORDER — ALBUTEROL SULFATE HFA 108 (90 BASE) MCG/ACT IN AERS
1.0000 | INHALATION_SPRAY | Freq: Four times a day (QID) | RESPIRATORY_TRACT | 0 refills | Status: AC | PRN
Start: 2023-10-21 — End: ?

## 2023-10-21 NOTE — Progress Notes (Signed)
 Virtual Visit Consent   Shelley Walton, you are scheduled for a virtual visit with a [facility] provider today. Just as with appointments in the office, your consent must be obtained to participate. Your consent will be active for this visit and any virtual visit you may have with one of our providers in the next 365 days. If you have a MyChart account, a copy of this consent can be sent to you electronically.  As this is a virtual visit, video technology does not allow for your provider to perform a traditional examination. This may limit your provider's ability to fully assess your condition. If your provider identifies any concerns that need to be evaluated in person or the need to arrange testing (such as labs, EKG, etc.), we will make arrangements to do so. Although advances in technology are sophisticated, we cannot ensure that it will always work on either your end or our end. If the connection with a video visit is poor, the visit may have to be switched to a telephone visit. With either a video or telephone visit, we are not always able to ensure that we have a secure connection.  By engaging in this virtual visit, you consent to the provision of healthcare and authorize for your insurance to be billed (if applicable) for the services provided during this visit. Depending on your insurance coverage, you may receive a charge related to this service.  I need to obtain your verbal consent now. Are you willing to proceed with your visit today? [name] has provided verbal consent on 10/21/2023 for a virtual visit (video or telephone). Margaretann Loveless, PA-C  Date: 10/21/2023 7:44 AM   Virtual Visit via Video Note   I, Margaretann Loveless, connected with  Shelley Walton  ([id number], March 09, 1996) on 10/21/23 at  7:45 AM EST by a video-enabled telemedicine application and verified that I am speaking with the correct person using two identifiers.  Location: Patient: Virtual Visit  Location Patient: Home Provider: Virtual Visit Location Provider: Home Office   I discussed the limitations of evaluation and management by telemedicine and the availability of in person appointments. The patient expressed understanding and agreed to proceed.    History of Present Illness: Shelley Walton is a 28 y.o. who identifies as a female who was assigned female at birth, and is being seen today for cough and chest tightness.  HPI: URI  This is a new problem. The current episode started in the past 7 days (Thursday with headache and cough started yesterday). The problem has been gradually worsening. There has been no fever. Associated symptoms include chest pain (tightness), congestion, coughing, headaches and wheezing (more labored than normal). Pertinent negatives include no diarrhea, ear pain, nausea, plugged ear sensation, rhinorrhea, sinus pain, sore throat or vomiting. Associated symptoms comments: Post nasal drainage. Treatments tried: mucinex. The treatment provided no relief.  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   Acne 02/24/2015   Asthma 02/24/2015    Allergies:  Allergies  Allergen Reactions   Amoxicillin Nausea And Vomiting   Medications:  Current Outpatient Medications:    albuterol (VENTOLIN HFA) 108 (90 Base) MCG/ACT inhaler, Inhale 1-2 puffs into the lungs every 6 (six) hours as needed., Disp: 8 g, Rfl: 0  benzonatate (TESSALON) 100 MG capsule, Take 1-2 capsules (100-200 mg total) by mouth 3 (three) times daily as needed., Disp: 30 capsule, Rfl: 0   predniSONE (DELTASONE) 20 MG tablet, Take 2 tablets (40 mg total) by mouth daily with breakfast., Disp: 10 tablet, Rfl: 0   fluticasone-salmeterol (WIXELA INHUB) 250-50 MCG/ACT AEPB, INHALE  1 PUFF INTO THE LUNGS IN THE MORNING AND AT BEDTIME, Disp: 60 each, Rfl: 2   metFORMIN (GLUCOPHAGE) 1000 MG tablet, Take 1,000 mg by mouth. (Patient not taking: Reported on 09/19/2023), Disp: , Rfl:    valACYclovir (VALTREX) 1000 MG tablet, Take 1g once daily for five days at first sign of outbreak. (Patient not taking: Reported on 09/19/2023), Disp: 30 tablet, Rfl: 5  Observations/Objective: Patient is well-developed, well-nourished in no acute distress.  Resting comfortably at home.  Head is normocephalic, atraumatic.  No labored breathing.  Speech is clear and coherent with logical content.  Patient is alert and oriented at baseline.    Assessment and Plan: 1. Moderate persistent asthma with exacerbation (Primary) - albuterol (VENTOLIN HFA) 108 (90 Base) MCG/ACT inhaler; Inhale 1-2 puffs into the lungs every 6 (six) hours as needed.  Dispense: 8 g; Refill: 0 - predniSONE (DELTASONE) 20 MG tablet; Take 2 tablets (40 mg total) by mouth daily with breakfast.  Dispense: 10 tablet; Refill: 0 - benzonatate (TESSALON) 100 MG capsule; Take 1-2 capsules (100-200 mg total) by mouth 3 (three) times daily as needed.  Dispense: 30 capsule; Refill: 0  - Worsening despite OTC medications - Will treat with Prednisone - Tessalon for cough - Can continue Mucinex  - Refilled Albuterol inhaler - Push fluids.  - Rest.  - Steam and humidifier can help - Seek in person evaluation if worsening or symptoms fail to improve    Follow Up Instructions: I discussed the assessment and treatment plan with the patient. The patient was provided an opportunity to ask questions and all were answered. The patient agreed with the plan and demonstrated an understanding of the instructions.  A copy of instructions were sent to the patient via MyChart unless otherwise noted below.    The patient was advised to call back or seek an in-person evaluation if the symptoms worsen or if the condition fails to improve as  anticipated.    Margaretann Loveless, PA-C

## 2023-10-21 NOTE — Patient Instructions (Signed)
 Shelley Walton, thank you for joining Shelley Loveless, PA-C for today's virtual visit.  While this provider is not your primary care provider (PCP), if your PCP is located in our provider database this encounter information will be shared with them immediately following your visit.   A [facility] MyChart account gives you access to today's visit and all your visits, tests, and labs performed at [facility] " click here if you don't have a [facility] MyChart account or go to [email or web address]  Consent: (Patient) Shelley Walton provided verbal consent for this virtual visit at the beginning of the encounter.  Current Medications:  Current Outpatient Medications:    albuterol (VENTOLIN HFA) 108 (90 Base) MCG/ACT inhaler, Inhale 1-2 puffs into the lungs every 6 (six) hours as needed., Disp: 8 g, Rfl: 0   benzonatate (TESSALON) 100 MG capsule, Take 1-2 capsules (100-200 mg total) by mouth 3 (three) times daily as needed., Disp: 30 capsule, Rfl: 0   predniSONE (DELTASONE) 20 MG tablet, Take 2 tablets (40 mg total) by mouth daily with breakfast., Disp: 10 tablet, Rfl: 0   fluticasone-salmeterol (WIXELA INHUB) 250-50 MCG/ACT AEPB, INHALE 1 PUFF INTO THE LUNGS IN THE MORNING AND AT BEDTIME, Disp: 60 each, Rfl: 2   metFORMIN (GLUCOPHAGE) 1000 MG tablet, Take 1,000 mg by mouth. (Patient not taking: Reported on 09/19/2023), Disp: , Rfl:    valACYclovir (VALTREX) 1000 MG tablet, Take 1g once daily for five days at first sign of outbreak. (Patient not taking: Reported on 09/19/2023), Disp: 30 tablet, Rfl: 5   Medications ordered in this encounter:  Meds ordered this encounter  Medications   albuterol (VENTOLIN HFA) 108 (90 Base) MCG/ACT inhaler    Sig: Inhale 1-2 puffs into the lungs every 6 (six) hours as needed.    Dispense:  8 g    Refill:  0    Supervising Provider:   Merrilee Jansky [[id number]]   predniSONE (DELTASONE) 20 MG tablet    Sig: Take 2 tablets (40 mg total)  by mouth daily with breakfast.    Dispense:  10 tablet    Refill:  0    Supervising Provider:   Merrilee Jansky [[id number]]   benzonatate (TESSALON) 100 MG capsule    Sig: Take 1-2 capsules (100-200 mg total) by mouth 3 (three) times daily as needed.    Dispense:  30 capsule    Refill:  0    Supervising Provider:   Merrilee Jansky [[id number]]     *If you need refills on other medications prior to your next appointment, please contact your pharmacy*  Follow-Up: Call back or seek an in-person evaluation if the symptoms worsen or if the condition fails to improve as anticipated.  [facility] [phone]  Other Instructions Asthma, Adult  Asthma is a long-term (chronic) condition that causes recurrent episodes in which the lower airways in the lungs become tight and narrow. The narrowing is caused by inflammation and tightening of the smooth muscle around the lower airways. Asthma episodes, also called asthma attacks or asthma flares, may cause coughing, making high-pitched whistling sounds when you breathe, most often when you breathe out (wheezing), shortness of breath, and chest pain. The airways may produce extra mucus caused by the inflammation and irritation. During an attack, it can be difficult to breathe. Asthma attacks can range from minor to life-threatening. Asthma cannot be cured, but medicines and lifestyle changes can help control it and treat acute attacks. It is  important to keep your asthma well controlled so the condition does not interfere with your daily life. What are the causes? This condition is believed to be caused by inherited (genetic) and environmental factors, but its exact cause is not known. What can trigger an asthma attack? Many things can bring on an asthma attack or make symptoms worse. These triggers are different for every person. Common triggers include: Allergens and irritants like mold, dust, pet dander, cockroaches, pollen, air  pollution, and chemical odors. Cigarette smoke. Weather changes and cold air. Stress and strong emotional responses such as crying or laughing hard. Certain medications such as aspirin or beta blockers. Infections and inflammatory conditions, such as the flu, a cold, pneumonia, or inflammation of the nasal membranes (rhinitis). Gastroesophageal reflux disease (GERD). What are the signs or symptoms? Symptoms may occur right after exposure to an asthma trigger or hours later and can vary by person. Common signs and symptoms include: Wheezing. Trouble breathing (shortness of breath). Excessive nighttime or early morning coughing. Chest tightness. Tiredness (fatigue) with minimal activity. Difficulty talking in complete sentences. Poor exercise tolerance. How is this diagnosed? This condition is diagnosed based on: A physical exam and your medical history. Tests, which may include: Lung function studies to evaluate the flow of air in your lungs. Allergy tests. Imaging tests, such as X-rays. How is this treated? There is no cure, but symptoms can be controlled with proper treatment. Treatment usually involves: Identifying and avoiding your asthma triggers. Inhaled medicines. Two types are commonly used to treat asthma, depending on severity: Controller medicines. These help prevent asthma symptoms from occurring. They are taken every day. Fast-acting reliever or rescue medicines. These quickly relieve asthma symptoms. They are used as needed and provide short-term relief. Using other medicines, such as: Allergy medicines, such as antihistamines, if your asthma attacks are triggered by allergens. Immune medicines (immunomodulators). These are medicines that help control the immune system. Using supplemental oxygen. This is only needed during a severe episode. Creating an asthma action plan. An asthma action plan is a written plan for managing and treating your asthma attacks. This plan  includes: A list of your asthma triggers and how to avoid them. Information about when medicines should be taken and when their dosage should be changed. Instructions about using a device called a peak flow meter. A peak flow meter measures how well the lungs are working and the severity of your asthma. It helps you monitor your condition. Follow these instructions at home: Take over-the-counter and prescription medicines only as told by your health care provider. Stay up to date on all vaccinations as recommended by your healthcare provider, including vaccines for the flu and pneumonia. Use a peak flow meter and keep track of your peak flow readings. Understand and use your asthma action plan to address any asthma flares. Do not smoke or allow anyone to smoke in your home. Contact a health care provider if: You have wheezing, shortness of breath, or a cough that is not responding to medicines. Your medicines are causing side effects, such as a rash, itching, swelling, or trouble breathing. You need to use a reliever medicine more than 2-3 times a week. Your peak flow reading is still at 50-79% of your personal best after following your action plan for 1 hour. You have a fever and shortness of breath. Get help right away if: You are getting worse and do not respond to treatment during an asthma attack. You are short of breath when at  rest or when doing very little physical activity. You have difficulty eating, drinking, or talking. You have chest pain or tightness. You develop a fast heartbeat or palpitations. You have a bluish color to your lips or fingernails. You are light-headed or dizzy, or you faint. Your peak flow reading is less than 50% of your personal best. You feel too tired to breathe normally. These symptoms may be an emergency. Get help right away. Call 911. Do not wait to see if the symptoms will go away. Do not drive yourself to the hospital. Summary Asthma is a  long-term (chronic) condition that causes recurrent episodes in which the airways become tight and narrow. Asthma episodes, also called asthma attacks or asthma flares, can cause coughing, wheezing, shortness of breath, and chest pain. Asthma cannot be cured, but medicines and lifestyle changes can help keep it well controlled and prevent asthma flares. Make sure you understand how to avoid triggers and how and when to use your medicines. Asthma attacks can range from minor to life-threatening. Get help right away if you have an asthma attack and do not respond to treatment with your usual rescue medicines. This information is not intended to replace advice given to you by your health care provider. Make sure you discuss any questions you have with your health care provider. Document Revised: 05/31/2021 Document Reviewed: 05/22/2021 Elsevier Patient Education  2024 Elsevier Inc.   If you have been instructed to have an in-person evaluation today at a local Urgent Care facility, please use the link below. It will take you to a list of all of our available [facility], including address, phone number and hours of operation. Please do not delay care.  [facility]  If you or a family member do not have a primary care provider, use the link below to schedule a visit and establish care. When you choose a [facility] primary care physician or advanced practice provider, you gain a long-term partner in health. Find a Primary Care Provider  Learn more about [facility]'s in-office and virtual care options:  - Get Care Now

## 2023-11-17 ENCOUNTER — Other Ambulatory Visit: Payer: Self-pay | Admitting: Family Medicine

## 2023-11-18 NOTE — Telephone Encounter (Signed)
 Requested medications are due for refill today.  no  Requested medications are on the active medications list.  no  Last refill. 09/18/2022   Future visit scheduled.   yes  Notes to clinic.  Medication not assigned to a protocol. Please review for refill.    Requested Prescriptions  Pending Prescriptions Disp Refills   fluconazole (DIFLUCAN) 150 MG tablet [Pharmacy Med Name: FLUCONAZOLE 150MG  TABLETS] 1 tablet 1    Sig: TAKE 1 TABLET(150 MG) BY MOUTH 1 TIME FOR 1 DOSE     Off-Protocol Failed - 11/18/2023  5:42 PM      Failed - Medication not assigned to a protocol, review manually.      Passed - Valid encounter within last 12 months    Recent Outpatient Visits           2 months ago Encounter for annual physical exam   [place] [facility] [place], Marzella Schlein, MD   1 year ago Encounter for annual physical exam   [place] [facility] [place], Marzella Schlein, MD   1 year ago Recurrent sinusitis   [place] [facility] Jacky Kindle, FNP   1 year ago Acute non-recurrent maxillary sinusitis   [place] [facility] Merita Norton T, FNP   1 year ago Moderate persistent asthmatic bronchitis with acute exacerbation   [facility] [place] Alfredia Ferguson, PA-C       Future Appointments             In 10 months [place], Marzella Schlein, MD [facility], [facility]

## 2023-12-23 ENCOUNTER — Other Ambulatory Visit: Payer: Self-pay | Admitting: Family Medicine

## 2023-12-25 ENCOUNTER — Other Ambulatory Visit: Payer: Self-pay

## 2023-12-25 ENCOUNTER — Telehealth: Admitting: Physician Assistant

## 2023-12-25 ENCOUNTER — Encounter: Payer: Self-pay | Admitting: Family Medicine

## 2023-12-25 DIAGNOSIS — J4540 Moderate persistent asthma, uncomplicated: Secondary | ICD-10-CM

## 2023-12-25 MED ORDER — FLUTICASONE-SALMETEROL 250-50 MCG/ACT IN AEPB
1.0000 | INHALATION_SPRAY | Freq: Two times a day (BID) | RESPIRATORY_TRACT | 11 refills | Status: AC
Start: 2023-12-25 — End: ?

## 2023-12-25 MED ORDER — FLUTICASONE-SALMETEROL 250-50 MCG/ACT IN AEPB
1.0000 | INHALATION_SPRAY | Freq: Two times a day (BID) | RESPIRATORY_TRACT | 0 refills | Status: DC
Start: 2023-12-25 — End: 2023-12-25

## 2023-12-25 NOTE — Progress Notes (Signed)
 E-Visit for Asthma  Based on what you have shared with me, it looks like you may have a flare up of your asthma.  Asthma is a chronic (ongoing) lung disease which results in airway obstruction, inflammation and hyper-responsiveness.   Asthma symptoms vary from person to person, with common symptoms including nighttime awakening and decreased ability to participate in normal activities as a result of shortness of breath. It is often triggered by changes in weather, changes in the season, changes in air temperature, or inside (home, school, daycare or work) allergens such as animal dander, mold, mildew, woodstoves or cockroaches.   It can also be triggered by hormonal changes, extreme emotion, physical exertion or an upper respiratory tract illness.     It is important to identify the trigger, and then eliminate or avoid the trigger if possible.   If you have been prescribed medications to be taken on a regular basis, it is important to follow the asthma action plan and to follow guidelines to adjust medication in response to increasing symptoms of decreased peak expiratory flow rate  Treatment: I have prescribed: Wixela Inhaler Use 1 puff twice daily  HOME CARE Only take medications as instructed by your medical team. Consider wearing a mask or scarf to improve breathing air temperature have been shown to decrease irritation and decrease exacerbations Get rest. Taking a steamy shower or using a humidifier may help nasal congestion sand ease sore throat pain. You can place a towel over your head and breathe in the steam from hot water coming from a faucet. Using a saline nasal spray works much the same way.  Cough drops, hare candies and sore throat lozenges may ease your cough.  Avoid close contacts especially the very you and the elderly Cover your mouth if you cough or sneeze Always  remember to wash your hands.    GET HELP RIGHT AWAY IF: You develop worsening symptoms; breathlessness at rest, drowsy, confused or agitated, unable to speak in full sentences You have coughing fits You develop a severe headache or visual changes You develop shortness of breath, difficulty breathing or start having chest pain Your symptoms persist after you have completed your treatment plan If your symptoms do not improve within 10 days  MAKE SURE YOU Understand these instructions. Will watch your condition. Will get help right away if you are not doing well or get worse.   Your e-visit answers were reviewed by a board certified advanced clinical practitioner to complete your personal care plan, Depending upon the condition, your plan could have included both over the counter or prescription medications.   Please review your pharmacy choice. Your safety is important to us . If you have drug allergies check your prescription carefully.  You can use MyChart to ask questions about today's visit, request a non-urgent  call back, or ask for a work or school excuse for 24 hours related to this e-Visit. If it has been greater than 24 hours you will need to follow up with your provider, or enter a new e-Visit to address those concerns.   You will get an e-mail in the next two days asking about your experience. I hope that your e-visit has been valuable and will speed your recovery. Thank you for using e-visits.    I have spent 5 minutes in review of e-visit questionnaire, review and updating patient chart, medical decision making and response to patient.   Angelia Kelp, PA-C

## 2024-01-04 ENCOUNTER — Telehealth: Payer: Self-pay | Admitting: Student

## 2024-01-04 ENCOUNTER — Encounter: Payer: Self-pay | Admitting: Emergency Medicine

## 2024-01-04 ENCOUNTER — Emergency Department: Admission: EM | Admit: 2024-01-04 | Discharge: 2024-01-04 | Disposition: A | Discharge status: Home / Self Care

## 2024-01-04 ENCOUNTER — Emergency Department

## 2024-01-04 ENCOUNTER — Other Ambulatory Visit: Payer: Self-pay

## 2024-01-04 DIAGNOSIS — Z3A09 9 weeks gestation of pregnancy: Secondary | ICD-10-CM | POA: Insufficient documentation

## 2024-01-04 DIAGNOSIS — O4690 Antepartum hemorrhage, unspecified, unspecified trimester: Secondary | ICD-10-CM

## 2024-01-04 DIAGNOSIS — O209 Hemorrhage in early pregnancy, unspecified: Secondary | ICD-10-CM | POA: Diagnosis present

## 2024-01-04 LAB — BASIC METABOLIC PANEL WITH GFR
Anion gap: 12 (ref 5–15)
BUN: 9 mg/dL (ref 6–20)
CO2: 23 mmol/L (ref 22–32)
Calcium: 9.7 mg/dL (ref 8.9–10.3)
Chloride: 101 mmol/L (ref 98–111)
Creatinine, Ser: 0.67 mg/dL (ref 0.44–1.00)
GFR, Estimated: >60 mL/min (ref >60)
Glucose, Bld: 93 mg/dL (ref 70–99)
Potassium: 4.8 mmol/L (ref 3.5–5.1)
Sodium: 136 mmol/L (ref 135–145)

## 2024-01-04 LAB — CBC
HCT: 40.2 % (ref 36.0–46.0)
Hemoglobin: 13.7 g/dL (ref 12.0–15.0)
MCH: 30.7 pg (ref 26.0–34.0)
MCHC: 34.1 g/dL (ref 30.0–36.0)
MCV: 90.1 fL (ref 80.0–100.0)
Platelets: 353 10*3/uL (ref 150–400)
RBC: 4.46 MIL/uL (ref 3.87–5.11)
RDW: 12.1 % (ref 11.5–15.5)
WBC: 11.2 10*3/uL — ABNORMAL HIGH (ref 4.0–10.5)
nRBC: 0 % (ref 0.0–0.2)

## 2024-01-04 LAB — POC URINE PREG, ED: Preg Test, Ur: POSITIVE — AB

## 2024-01-04 LAB — HCG, QUANTITATIVE, PREGNANCY: hCG, Beta Chain, Quant, S: 98020 m[IU]/mL — ABNORMAL HIGH (ref <5)

## 2024-01-04 NOTE — ED Triage Notes (Signed)
 Pt via POV from home. Pt c/o brown vaginal bleeding starting this morning. Reports [redacted] weeks pregnant. Denies abd cramping. Reports that she is currently taking heparin injection for a clotting disorder. 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 Pt is A&OX4 and NAD

## 2024-01-04 NOTE — ED Notes (Signed)
 Patient is out of the room to ultrasound.

## 2024-01-04 NOTE — ED Notes (Signed)
Report given to Cat RN

## 2024-01-04 NOTE — Telephone Encounter (Cosign Needed)
 Informed patient of normal ultrasound results.

## 2024-01-04 NOTE — ED Provider Notes (Signed)
 [facility] Provider Note    Event Date/Time   First MD Initiated Contact with Patient 01/04/24 1419     (approximate)   History   Vaginal Bleeding   HPI  Shelley Walton is a 28 y.o. female with PMH of asthma and clotting disorder for which she takes heparin injections presents for evaluation of vaginal bleeding that began this morning.  Patient states she is about [redacted] weeks pregnant.  She noticed some brown discharge.  She denies abdominal cramping.  She reports history of 2 miscarriages.       Physical Exam   Triage Vital Signs: ED Triage Vitals  Encounter Vitals Group     BP 01/04/24 1410 106/77     Systolic BP Percentile --      Diastolic BP Percentile --      Pulse Rate 01/04/24 1407 97     Resp 01/04/24 1407 18     Temp 01/04/24 1407 98.4 F (36.9 C)     Temp Source 01/04/24 1407 Oral     SpO2 01/04/24 1407 100 %     Weight 01/04/24 1405 160 lb (72.6 kg)     Height 01/04/24 1405 5\' 4"  (1.626 m)     Head Circumference --      Peak Flow --      Pain Score 01/04/24 1405 0     Pain Loc --      Pain Education --      Exclude from Growth Chart --     Most recent vital signs: Vitals:   01/04/24 1407 01/04/24 1410  BP:  106/77  Pulse: 97   Resp: 18   Temp: 98.4 F (36.9 C)   SpO2: 100%    General: Awake, no distress.  CV:  Good peripheral perfusion. RRR. Resp:  Normal effort. CTAB. Abd:  No distention. Soft, nontender to palpation. Other:     ED Results / Procedures / Treatments   Labs (all labs ordered are listed, but only abnormal results are displayed) Labs Reviewed  CBC - Abnormal; Notable for the following components:      Result Value   WBC 11.2 (*)    All other components within normal limits  HCG, QUANTITATIVE, PREGNANCY - Abnormal; Notable for the following components:   hCG, Beta Chain, Quant, S 98,020 (*)    All other components within normal limits  POC URINE PREG, ED - Abnormal; Notable for the following  components:   Preg Test, Ur Positive (*)    All other components within normal limits  BASIC METABOLIC PANEL WITH GFR     RADIOLOGY  OB ultrasound obtained, interpreted the images as well as reviewed the radiologist report.  Ultrasound shows IUP measuring about 9 weeks 5 days with heart rate of 158 bpm.   PROCEDURES:  Critical Care performed: No  Procedures   MEDICATIONS ORDERED IN ED: Medications - No data to display   IMPRESSION / MDM / ASSESSMENT AND PLAN / ED COURSE  I reviewed the triage vital signs and the nursing notes.                             28 year old female who is about [redacted] weeks pregnant presents for evaluation of brown vaginal bleeding.  Vital signs are stable, patient NAD on exam.  Differential diagnosis includes, but is not limited to, threatened miscarriage, subchorionic hematoma, implantation bleeding, ectopic pregnancy.  Patient's presentation is most consistent  with acute complicated illness / injury requiring diagnostic workup.  Urine pregnancy is positive and beta-hCG is 98,020.  BMP and CBC are unremarkable.    My interpretation of OB ultrasound shows intrauterine pregnancy measuring about 9 weeks and 5 days with fetal heart rate of 158 bpm.   Patient's blood type is a positive so no indication for RhoGAM today.  Patient states that they need to get home as her mother has a trip she needs to get ready for. I explained that I did not have an official radiology read on the ultrasound yet.  Patient was okay with leaving without these results. We discussed risks of leaving before official read, she was comfortable with this. I advised her to follow-up with her OB in 2 days for repeat beta.  Patient voiced understanding, all questions were answered and she was stable at discharge.     FINAL CLINICAL IMPRESSION(S) / ED DIAGNOSES   Final diagnoses:  Vaginal bleeding in pregnancy     Rx / DC Orders   ED Discharge Orders     None         Note:  This document was prepared using Dragon voice recognition software and may include unintentional dictation errors.   Phyliss Breen, PA-C 01/04/24 1722    Collis Deaner, MD 01/05/24 1924

## 2024-01-04 NOTE — Discharge Instructions (Signed)
 Please schedule follow-up with your OB/GYN in 2 days for repeat beta hcg.

## 2024-01-10 ENCOUNTER — Emergency Department
Admission: EM | Admit: 2024-01-10 | Discharge: 2024-01-10 | Discharge status: Against Medical Advice | Attending: Emergency Medicine | Admitting: Emergency Medicine

## 2024-01-10 ENCOUNTER — Emergency Department

## 2024-01-10 ENCOUNTER — Other Ambulatory Visit: Payer: Self-pay

## 2024-01-10 ENCOUNTER — Encounter: Payer: Self-pay | Admitting: Intensive Care

## 2024-01-10 DIAGNOSIS — Z3A10 10 weeks gestation of pregnancy: Secondary | ICD-10-CM | POA: Diagnosis not present

## 2024-01-10 DIAGNOSIS — O209 Hemorrhage in early pregnancy, unspecified: Secondary | ICD-10-CM | POA: Insufficient documentation

## 2024-01-10 DIAGNOSIS — Z5321 Procedure and treatment not carried out due to patient leaving prior to being seen by health care provider: Secondary | ICD-10-CM | POA: Diagnosis not present

## 2024-01-10 LAB — COMPREHENSIVE METABOLIC PANEL WITH GFR
ALT: 15 U/L (ref 0–44)
AST: 16 U/L (ref 15–41)
Albumin: 4 g/dL (ref 3.5–5.0)
Alkaline Phosphatase: 34 U/L — ABNORMAL LOW (ref 38–126)
Anion gap: 9 (ref 5–15)
BUN: 14 mg/dL (ref 6–20)
CO2: 25 mmol/L (ref 22–32)
Calcium: 9.4 mg/dL (ref 8.9–10.3)
Chloride: 99 mmol/L (ref 98–111)
Creatinine, Ser: 0.59 mg/dL (ref 0.44–1.00)
GFR, Estimated: >60 mL/min (ref >60)
Glucose, Bld: 93 mg/dL (ref 70–99)
Potassium: 4.6 mmol/L (ref 3.5–5.1)
Sodium: 133 mmol/L — ABNORMAL LOW (ref 135–145)
Total Bilirubin: 0.6 mg/dL (ref 0.0–1.2)
Total Protein: 7.4 g/dL (ref 6.5–8.1)

## 2024-01-10 LAB — CBC WITH DIFFERENTIAL/PLATELET
Abs Immature Granulocytes: 0.06 10*3/uL (ref 0.00–0.07)
Basophils Absolute: 0.1 10*3/uL (ref 0.0–0.1)
Basophils Relative: 1 %
Eosinophils Absolute: 0.7 10*3/uL — ABNORMAL HIGH (ref 0.0–0.5)
Eosinophils Relative: 7 %
HCT: 38.8 % (ref 36.0–46.0)
Hemoglobin: 13.2 g/dL (ref 12.0–15.0)
Immature Granulocytes: 1 %
Lymphocytes Relative: 22 %
Lymphs Abs: 2.4 10*3/uL (ref 0.7–4.0)
MCH: 30.6 pg (ref 26.0–34.0)
MCHC: 34 g/dL (ref 30.0–36.0)
MCV: 89.8 fL (ref 80.0–100.0)
Monocytes Absolute: 0.7 10*3/uL (ref 0.1–1.0)
Monocytes Relative: 7 %
Neutro Abs: 6.7 10*3/uL (ref 1.7–7.7)
Neutrophils Relative %: 62 %
Platelets: 343 10*3/uL (ref 150–400)
RBC: 4.32 MIL/uL (ref 3.87–5.11)
RDW: 12.2 % (ref 11.5–15.5)
WBC: 10.6 10*3/uL — ABNORMAL HIGH (ref 4.0–10.5)
nRBC: 0 % (ref 0.0–0.2)

## 2024-01-10 LAB — HCG, QUANTITATIVE, PREGNANCY: hCG, Beta Chain, Quant, S: 64044 m[IU]/mL — ABNORMAL HIGH (ref <5)

## 2024-01-10 LAB — ABO/RH: ABO/RH(D): A POS

## 2024-01-10 NOTE — ED Triage Notes (Signed)
 Patient reports she is [redacted] weeks pregnant. Today started having bright red clots and some bleeding. Seen for same on 01/04/24 and reports no bleeding until today since then. Reports US  on 5/10 was unremarkable  Denies pain  History miscarriages.

## 2024-01-31 ENCOUNTER — Telehealth: Payer: Self-pay | Admitting: Family Medicine

## 2024-01-31 NOTE — Telephone Encounter (Signed)
 Copied from CRM [id number]. Topic: Clinical - Medication Refill >> Jan 31, 2024 12:50 PM Carla L wrote: Medication: fluticasone -salmeterol (WIXELA INHUB) 250-50 MCG/ACT AEPB Pt has reached out to pharmacy and informed the rx has been denied. Advised pt did not see an rx request for prescription and rx sent to pharmacy on 12/25/2023 w/ 11 refills. Pt states she is out & requesting refill to be sent as soon as possible as pharmacy informed her they cannot fill. Advised of turnaround time.  Has the patient contacted their pharmacy? Yes  This is the patient's preferred pharmacy:  [id number] - [place], [place] - [address] AT [facility] [address] [place] [address] [place] [address] Phone: [phone] Fax: [phone]  Is this the correct pharmacy for this prescription? Yes  Has the prescription been filled recently? No  Is the patient out of the medication? Yes  Has the patient been seen for an appointment in the last year OR does the patient have an upcoming appointment? Yes  Can we respond through MyChart? Yes  Agent: Please be advised that Rx refills may take up to 3 business days. We ask that you follow-up with your pharmacy.

## 2024-01-31 NOTE — Telephone Encounter (Signed)
 [facility] called and spoke to [place], Pensions consultant about the refill(s) Wixela inhaler requested. Advised it was sent on 12/25/23 w/11 refill(s). She says she's able to process it and it will be ready today for pickup.

## 2024-09-21 ENCOUNTER — Encounter: Payer: Self-pay | Admitting: Family Medicine

## 2024-09-22 ENCOUNTER — Encounter: Admitting: Family Medicine

## 2024-10-20 ENCOUNTER — Encounter: Admitting: Family Medicine
